# Patient Record
Sex: Female | Born: 1975 | Race: White | Hispanic: No | Marital: Married | Smoking: Never smoker
Health system: Southern US, Community
[De-identification: ages and names within clinical notes are randomized; demographics above are authoritative.]

## PROBLEM LIST (undated history)

## (undated) DIAGNOSIS — K589 Irritable bowel syndrome without diarrhea: Secondary | ICD-10-CM

## (undated) DIAGNOSIS — F419 Anxiety disorder, unspecified: Secondary | ICD-10-CM

## (undated) DIAGNOSIS — Z86718 Personal history of other venous thrombosis and embolism: Secondary | ICD-10-CM

## (undated) DIAGNOSIS — Z8759 Personal history of other complications of pregnancy, childbirth and the puerperium: Secondary | ICD-10-CM

## (undated) HISTORY — DX: Anxiety disorder, unspecified: F41.9

## (undated) HISTORY — DX: Irritable bowel syndrome, unspecified: K58.9

---

## 2011-05-09 ENCOUNTER — Ambulatory Visit: Payer: Self-pay | Admitting: Family Medicine

## 2011-06-16 ENCOUNTER — Ambulatory Visit: Payer: Self-pay | Admitting: Surgery

## 2011-06-21 ENCOUNTER — Ambulatory Visit: Payer: Self-pay | Admitting: Surgery

## 2011-06-22 LAB — PATHOLOGY REPORT

## 2011-08-16 HISTORY — PX: CHOLECYSTECTOMY: SHX55

## 2013-08-22 IMAGING — US US ABDOMEN LIMITED SLG ORGAN/ASCITES
1 series · 17 of 25 positions shown · non-contrast
Comparison: none

REASON FOR EXAM: Epigastric Pain Nausea Eval Gallbladder
COMMENTS:

[Series 1: us abdomen limited slg organ/ascites · 17 of 32 slices shown]
[im 1/32]
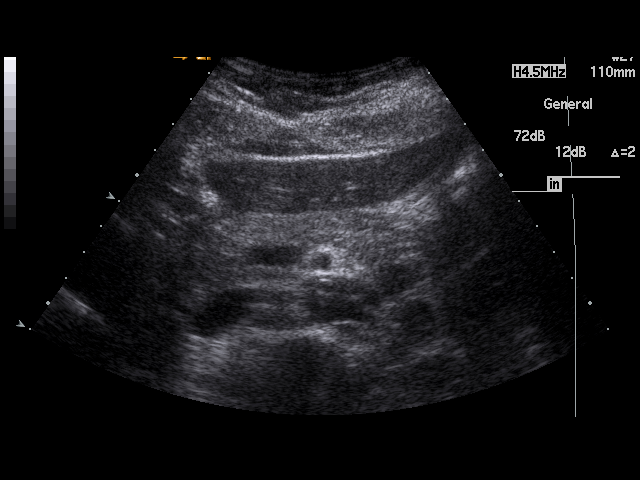
[im 3/32]
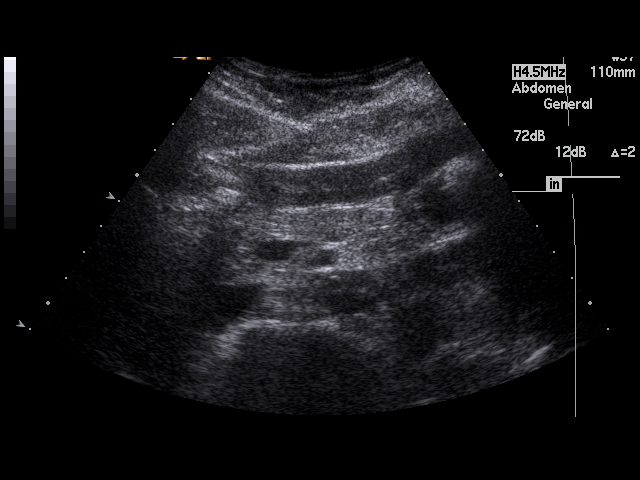
[im 4/32]
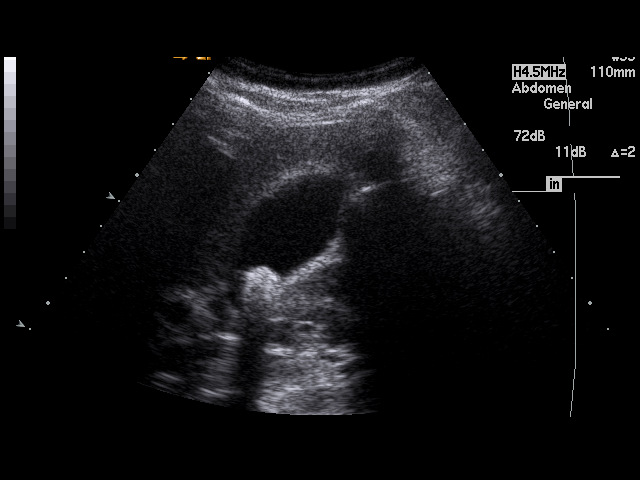
[im 7/32]
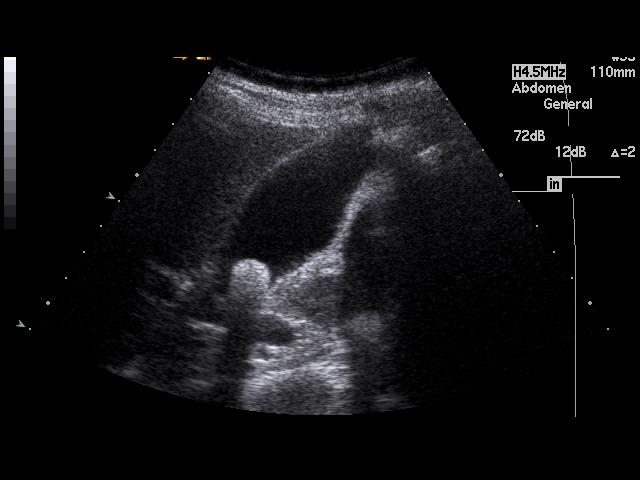
[im 8/32]
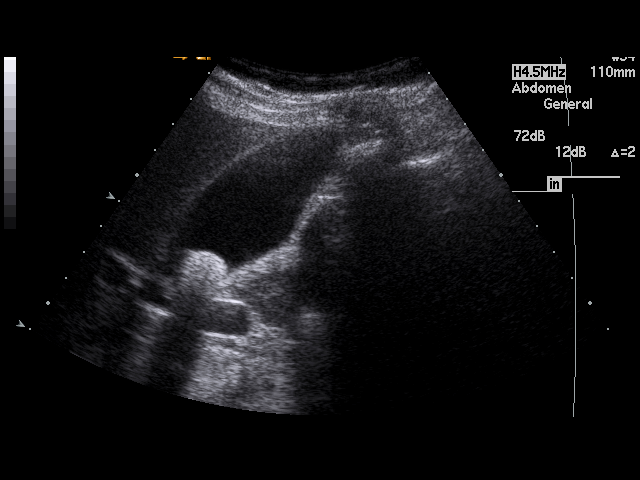
[im 11/32]
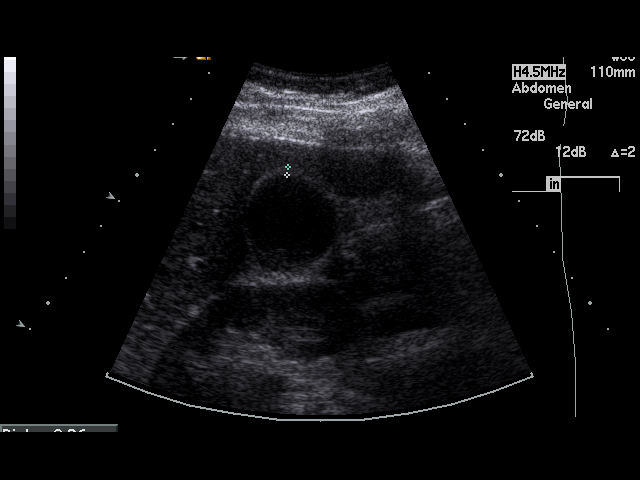
[im 12/32]
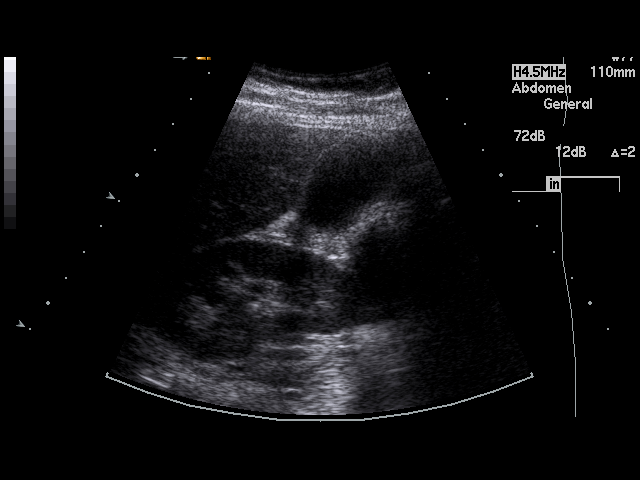
[im 15/32]
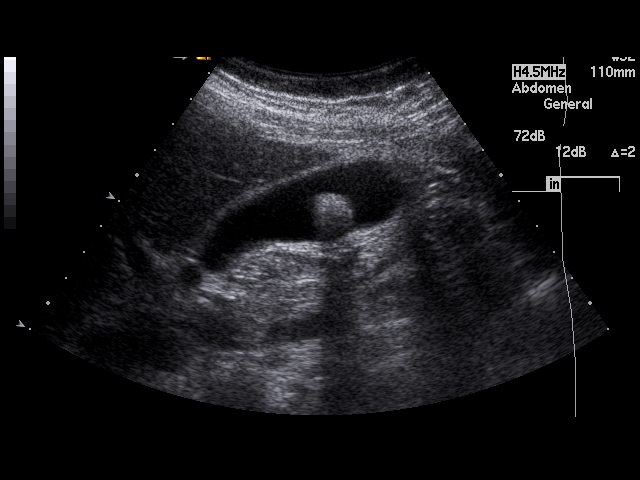
[im 16/32]
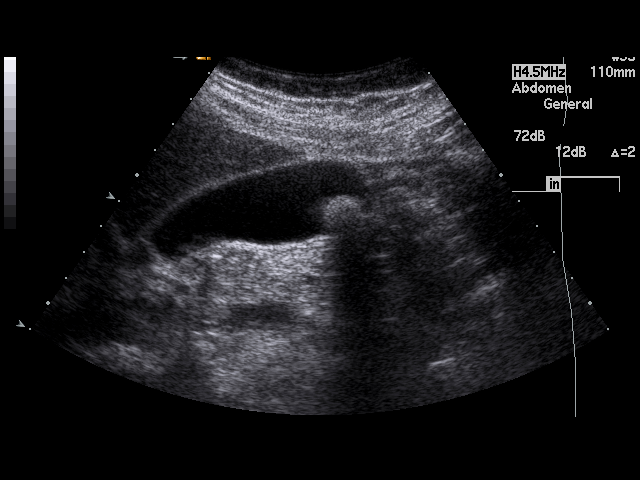
[im 17/32]
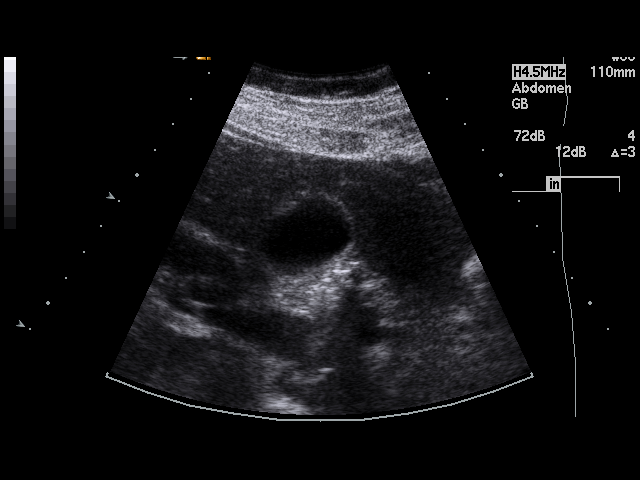
[im 20/32]
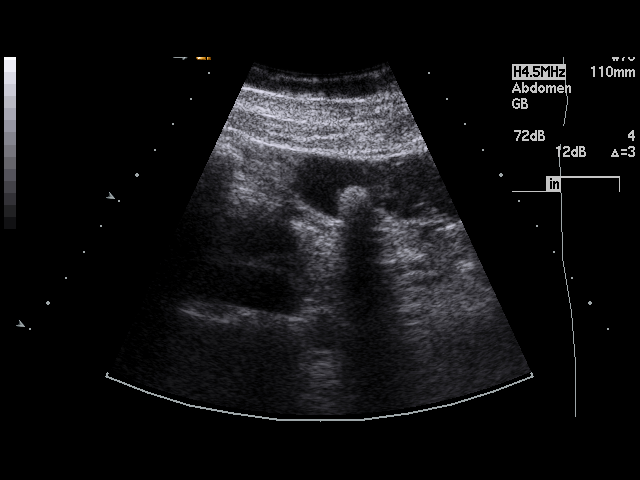
[im 21/32]
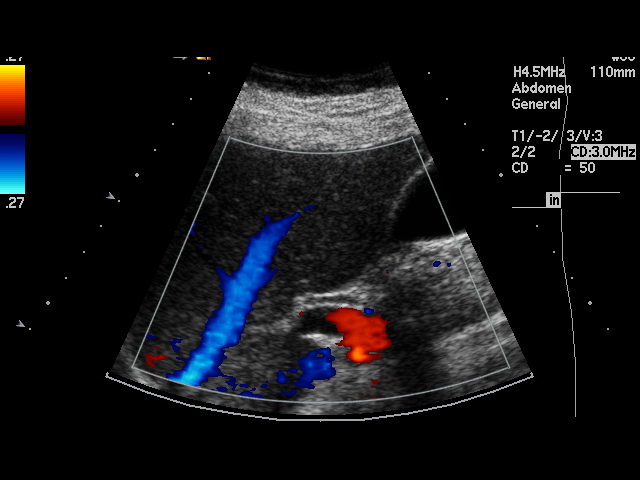
[im 24/32]
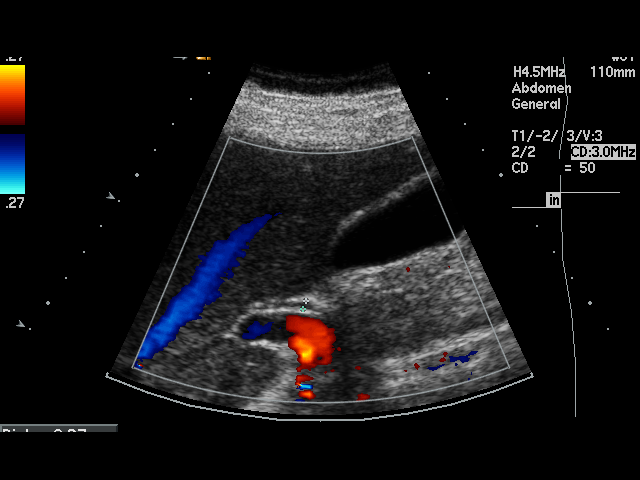
[im 25/32]
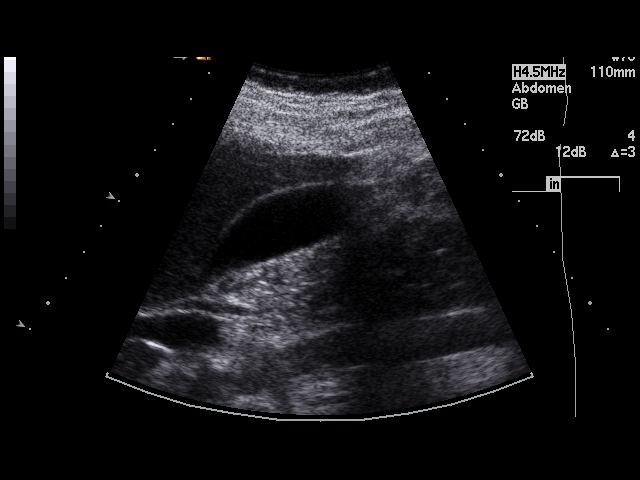
[im 28/32]
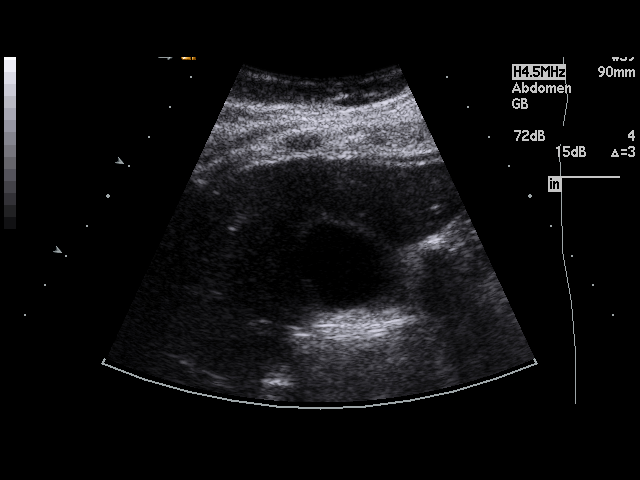
[im 29/32]
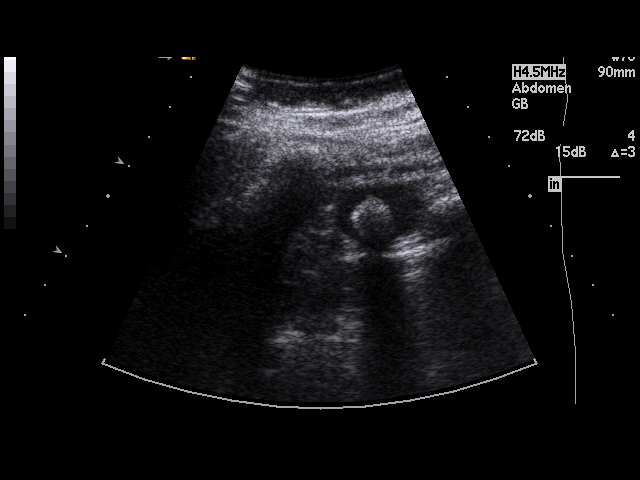
[im 32/32]
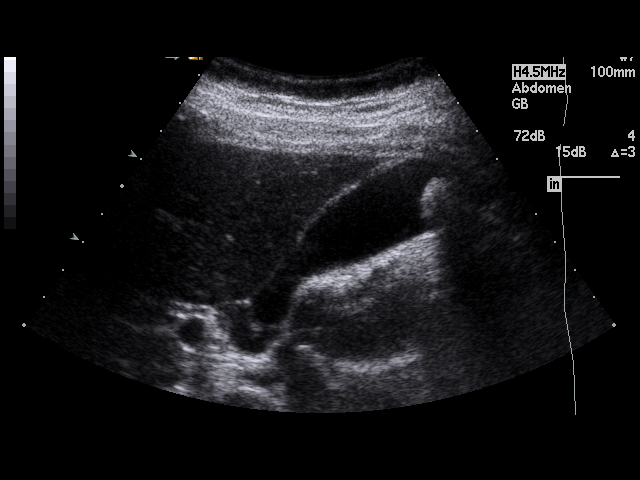

[17 of 25 positions shown; findings below may reference images not displayed]

PROCEDURE:     ADDY - ADDY ABDOMEN LTD 1 ORGAN OR QUAD  - May 09, 2011  [DATE]

RESULT:     Limited examination targeted to the gallbladder was performed.
There are mobile echo densities in the gallbladder compatible with
gallstones. There is no thickening of the gallbladder wall which measures
2.6 mm in thickness. Common bile duct measures 3.3 mm in diameter which is
within normal limits.

Note is made that the pancreas is visualized and is normal in appearance.
IMPRESSION: Cholelithiasis.

## 2015-03-19 ENCOUNTER — Ambulatory Visit (HOSPITAL_BASED_OUTPATIENT_CLINIC_OR_DEPARTMENT_OTHER)
Admission: RE | Admit: 2015-03-19 | Discharge: 2015-03-19 | Disposition: A | Discharge status: Home / Self Care | Payer: BLUE CROSS/BLUE SHIELD | Source: Ambulatory Visit | Attending: Obstetrics and Gynecology | Admitting: Obstetrics and Gynecology

## 2015-03-19 ENCOUNTER — Ambulatory Visit
Admission: RE | Admit: 2015-03-19 | Discharge: 2015-03-19 | Disposition: A | Discharge status: Home / Self Care | Payer: BLUE CROSS/BLUE SHIELD | Source: Ambulatory Visit | Attending: Obstetrics and Gynecology | Admitting: Obstetrics and Gynecology

## 2015-03-19 VITALS — BP 123/83 | Temp 98.2°F | Resp 18 | Ht 62.4 in | Wt 152.6 lb

## 2015-03-19 DIAGNOSIS — Z3A01 Less than 8 weeks gestation of pregnancy: Secondary | ICD-10-CM | POA: Diagnosis not present

## 2015-03-19 DIAGNOSIS — O99111 Other diseases of the blood and blood-forming organs and certain disorders involving the immune mechanism complicating pregnancy, first trimester: Secondary | ICD-10-CM

## 2015-03-19 DIAGNOSIS — O09899 Supervision of other high risk pregnancies, unspecified trimester: Secondary | ICD-10-CM | POA: Diagnosis present

## 2015-03-19 DIAGNOSIS — O021 Missed abortion: Secondary | ICD-10-CM | POA: Diagnosis not present

## 2015-03-19 DIAGNOSIS — Z86718 Personal history of other venous thrombosis and embolism: Secondary | ICD-10-CM

## 2015-03-19 DIAGNOSIS — D6859 Other primary thrombophilia: Secondary | ICD-10-CM

## 2015-03-19 HISTORY — DX: Personal history of other venous thrombosis and embolism: Z87.59

## 2015-03-19 HISTORY — DX: Personal history of other venous thrombosis and embolism: Z86.718

## 2015-03-19 LAB — CBC
HCT: 36.3 % (ref 35.0–47.0)
Hemoglobin: 12.5 g/dL (ref 12.0–16.0)
MCH: 30.4 pg (ref 26.0–34.0)
MCHC: 34.5 g/dL (ref 32.0–36.0)
MCV: 88 fL (ref 80.0–100.0)
Platelets: 274 10*3/uL (ref 150–440)
RBC: 4.12 MIL/uL (ref 3.80–5.20)
RDW: 13.2 % (ref 11.5–14.5)
WBC: 10.3 10*3/uL (ref 3.6–11.0)

## 2015-03-19 LAB — US MFM OB TRANSVAGINAL

## 2015-03-19 LAB — ANTIBODY SCREEN: Antibody Screen: NEGATIVE

## 2015-03-19 MED ORDER — ENOXAPARIN SODIUM 40 MG/0.4ML ~~LOC~~ SOLN
40.0000 mg | Freq: Once | SUBCUTANEOUS | Status: AC
  Administered 2015-03-19: 40 mg via SUBCUTANEOUS
  Filled 2015-03-19: qty 0.4

## 2015-03-19 MED ORDER — ENOXAPARIN SODIUM 40 MG/0.4ML ~~LOC~~ SOLN: 40.0000 mg | SUBCUTANEOUS | Status: DC

## 2015-03-19 NOTE — Progress Notes (Signed)
39 yo G2 p1001 WF form [place] referred from [facility] due to h/o DVT and positive pregnancy test  DVT hx-At gymnastic camp at age 86 associated with injury, Also notes her aunt died of VTE during a sab  Pt received no thrombo-prophylaxis during her pregnancy with her 76 yo daughter   U/s No FHR noted - 9mm embryo -   A&P Likely sab desired pregnancy  will f/u  8/11 for repeat scan  I contacted Nils Flack at [facility] [phone] for [facility]  Pt will call to establish care there and with Dr Dalbert Garnet at [facility]   For h/o DVT  Lovenox Rxd 40 qd hold for bleeding and at least 24 hours before a procedure- if D&C is required  Dr Fayrene Fearing recommends 2 weeks of postop lovenox Pt has difficulty with needles will bring her daughter to clinic for teaching of administration    Jimmey Ralph

## 2015-03-20 LAB — ABO/RH: ABO/RH(D): A POS

## 2015-03-23 ENCOUNTER — Ambulatory Visit
Admission: RE | Admit: 2015-03-23 | Discharge: 2015-03-23 | Disposition: A | Discharge status: Home / Self Care | Payer: BLUE CROSS/BLUE SHIELD | Source: Ambulatory Visit | Attending: Obstetrics and Gynecology | Admitting: Obstetrics and Gynecology

## 2015-03-23 NOTE — ED Notes (Signed)
Patient and patient's daughter educated on the administration of lovenox injection.  Patient's daughter verbalized understanding, demonstrated technique via teach back.  Patient instructed to start lovenox injections 24 hour post procedure, which will be performed tomorrow at [facility].  Patient instructed to start injection the Wednesday and also inform provider of medication prescribed.  Patient and patient's daughter verbalized understanding, questions answered.

## 2015-03-24 DIAGNOSIS — O039 Complete or unspecified spontaneous abortion without complication: Secondary | ICD-10-CM | POA: Insufficient documentation

## 2015-03-24 DIAGNOSIS — I82409 Acute embolism and thrombosis of unspecified deep veins of unspecified lower extremity: Secondary | ICD-10-CM | POA: Insufficient documentation

## 2015-03-26 ENCOUNTER — Inpatient Hospital Stay: Admission: RE | Admit: 2015-03-26 | Payer: BLUE CROSS/BLUE SHIELD | Source: Ambulatory Visit

## 2016-01-22 ENCOUNTER — Encounter (HOSPITAL_COMMUNITY): Payer: Self-pay | Admitting: *Deleted

## 2019-08-13 ENCOUNTER — Ambulatory Visit (INDEPENDENT_AMBULATORY_CARE_PROVIDER_SITE_OTHER): Payer: BC Managed Care – PPO | Admitting: Family Medicine

## 2019-08-13 ENCOUNTER — Other Ambulatory Visit: Payer: Self-pay

## 2019-08-13 ENCOUNTER — Encounter: Payer: Self-pay | Admitting: Family Medicine

## 2019-08-13 VITALS — BP 130/100 | HR 84 | Ht 59.0 in | Wt 160.0 lb

## 2019-08-13 DIAGNOSIS — F419 Anxiety disorder, unspecified: Secondary | ICD-10-CM | POA: Diagnosis not present

## 2019-08-13 DIAGNOSIS — G43909 Migraine, unspecified, not intractable, without status migrainosus: Secondary | ICD-10-CM

## 2019-08-13 DIAGNOSIS — Z7689 Persons encountering health services in other specified circumstances: Secondary | ICD-10-CM

## 2019-08-13 DIAGNOSIS — I10 Essential (primary) hypertension: Secondary | ICD-10-CM

## 2019-08-13 DIAGNOSIS — F329 Major depressive disorder, single episode, unspecified: Secondary | ICD-10-CM

## 2019-08-13 DIAGNOSIS — L309 Dermatitis, unspecified: Secondary | ICD-10-CM | POA: Diagnosis not present

## 2019-08-13 MED ORDER — HYDROCHLOROTHIAZIDE 12.5 MG PO CAPS: 12.5000 mg | ORAL_CAPSULE | Freq: Every day | ORAL | 1 refills | Status: DC

## 2019-08-13 NOTE — Progress Notes (Signed)
Date:  08/13/2019   Name:  Laura Galloway   DOB:  06-21-1976   MRN:  [id number]   Chief Complaint: Establish Care (PHQ9=10 and GAD7=14), Rash (hands have an irritation- been told she has contact dermatitis- creams not helping. Nails have ridges in them), and Headache (referral to neurology)  Patient is a 43 year old female who presents for a establishment of care exam. The patient reports the following problems: rrash/hands. Health maintenance has been reviewed and we will establish for Pap smear and physical at a later date when we recheck her blood pressure.  Rash This is a chronic problem. The current episode started more than 1 year ago. The problem has been waxing and waning since onset. The affected locations include the right hand and left hand. The rash is characterized by itchiness and redness. Associated with: chemicals. Associated symptoms include joint pain. Pertinent negatives include no anorexia, congestion, cough, diarrhea, eye pain, facial edema, fatigue, fever, nail changes, rhinorrhea, shortness of breath, sore throat or vomiting. Past treatments include topical steroids (mometasone). The treatment provided moderate relief. Her past medical history is significant for eczema. (Sister ? deformity)  Headache  This is a chronic problem. The current episode started more than 1 year ago. The problem occurs intermittently. The problem has been waxing and waning. The pain is located in the bilateral and retro-orbital region. The pain does not radiate. The quality of the pain is described as throbbing. The pain is at a severity of 8/10. Associated symptoms include blurred vision, insomnia, nausea, phonophobia and photophobia. Pertinent negatives include no abdominal pain, anorexia, back pain, coughing, dizziness, drainage, ear pain, eye pain, eye watering, fever, neck pain, numbness, rhinorrhea, sore throat or vomiting. Associated symptoms comments: ? Aura/nausea. The symptoms are  aggravated by bright light, noise and menstrual cycle. She has tried NSAIDs and acetaminophen (ibuprfen/tylenol) for the symptoms. The treatment provided mild relief. Her past medical history is significant for migraine headaches. (Sister ? deformity)  Depression        This is a chronic problem.  The current episode started more than 1 month ago.   The problem has been gradually improving since onset.  Associated symptoms include helplessness, hopelessness, insomnia, restlessness, decreased interest, headaches and sad.  Associated symptoms include no decreased concentration, no fatigue, not irritable, no appetite change, no body aches, no myalgias, no indigestion and no suicidal ideas.  Past treatments include SSRIs - Selective serotonin reuptake inhibitors.  Compliance with treatment is good.  Previous treatment provided moderate relief.  (sister ? deformity)   No results found for: CREATININE, BUN, NA, K, CL, CO2 No results found for: CHOL, HDL, LDLCALC, LDLDIRECT, TRIG, CHOLHDL No results found for: TSH No results found for: HGBA1C   Review of Systems  Constitutional: Negative for appetite change, chills, fatigue and fever.  HENT: Negative for congestion, drooling, ear discharge, ear pain, rhinorrhea and sore throat.   Eyes: Positive for blurred vision and photophobia. Negative for pain.  Respiratory: Negative for cough, shortness of breath and wheezing.   Cardiovascular: Negative for chest pain, palpitations and leg swelling.  Gastrointestinal: Positive for nausea. Negative for abdominal pain, anorexia, blood in stool, constipation, diarrhea and vomiting.  Endocrine: Negative for polydipsia.  Genitourinary: Negative for dysuria, frequency, hematuria and urgency.  Musculoskeletal: Positive for joint pain. Negative for back pain, myalgias and neck pain.  Skin: Positive for rash. Negative for nail changes.  Allergic/Immunologic: Negative for environmental allergies.  Neurological: Positive  for headaches. Negative for dizziness  and numbness.  Hematological: Negative for adenopathy. Does not bruise/bleed easily.  Psychiatric/Behavioral: Positive for depression. Negative for decreased concentration and suicidal ideas. The patient has insomnia. The patient is not nervous/anxious.     Patient Active Problem List   Diagnosis Date Noted  . DVT (deep venous thrombosis) (HCC) 03/24/2015  . Miscarriage 03/24/2015  . History of venous thromboembolism 03/19/2015  . [redacted] weeks gestation of pregnancy 03/19/2015    Allergies  Allergen Reactions  . Oxycodone Itching    Past Surgical History:  Procedure Laterality Date  . CHOLECYSTECTOMY  2013    Social History   Tobacco Use  . Smoking status: Never Smoker  . Smokeless tobacco: Never Used  Substance Use Topics  . Alcohol use: Yes    Comment: Last use 3 weeks ago  . Drug use: No     Medication list has been reviewed and updated.  Current Meds  Medication Sig  . dicyclomine (BENTYL) 20 MG tablet Take by mouth. GI  . mometasone (ELOCON) 0.1 % cream Apply topically.    PHQ 2/9 Scores 08/13/2019  PHQ - 2 Score 1  PHQ- 9 Score 10    BP Readings from Last 3 Encounters:  08/13/19 (!) 130/100  03/19/15 123/83    Physical Exam Vitals and nursing note reviewed.  Constitutional:      General: She is not irritable.    Appearance: She is well-developed.  HENT:     Head: Normocephalic.     Right Ear: External ear normal.     Left Ear: External ear normal.  Eyes:     General: Lids are everted, no foreign bodies appreciated. No scleral icterus.       Left eye: No foreign body or hordeolum.     Extraocular Movements: Extraocular movements intact.     Conjunctiva/sclera: Conjunctivae normal.     Right eye: Right conjunctiva is not injected.     Left eye: Left conjunctiva is not injected.     Pupils: Pupils are equal, round, and reactive to light.  Neck:     Thyroid: No thyromegaly.     Vascular: No JVD.     Trachea:  No tracheal deviation.  Cardiovascular:     Rate and Rhythm: Normal rate and regular rhythm.     Heart sounds: Normal heart sounds. No murmur. No friction rub. No gallop.   Pulmonary:     Effort: Pulmonary effort is normal. No respiratory distress.     Breath sounds: Normal breath sounds. No wheezing or rales.  Abdominal:     General: Bowel sounds are normal.     Palpations: Abdomen is soft. There is no mass.     Tenderness: There is no abdominal tenderness. There is no guarding or rebound.  Musculoskeletal:        General: No tenderness. Normal range of motion.     Cervical back: Normal range of motion and neck supple.  Lymphadenopathy:     Cervical: No cervical adenopathy.  Skin:    General: Skin is warm.     Findings: Rash present. Rash is scaling.  Neurological:     Mental Status: She is alert and oriented to person, place, and time.     Cranial Nerves: No cranial nerve deficit.     Deep Tendon Reflexes: Reflexes normal.  Psychiatric:        Mood and Affect: Mood is not anxious or depressed.     Wt Readings from Last 3 Encounters:  08/13/19 160 lb (72.6 kg)  03/19/15 152 lb 9.6 oz (69.2 kg)    BP (!) 130/100   Pulse 84   Ht 4\' 11"  (1.499 m)   Wt 160 lb (72.6 kg)   BMI 32.32 kg/m   Assessment and Plan:  1. Establishing care with new doctor, encounter for Patient establishing care with new physician.  Patient's previous encounters in the current system as well as care elsewhere at [facility] have been reviewed.  There is no recent imaging to discuss.  Lab work was also reviewed.  2. Migraine without status migrainosus, not intractable, unspecified migraine type Patient's had a long history of migraines for bilateral retro-orbital.  Patient is apparently not been on any Imitrex and is uncertain but possibly there has been some preventative possibly with Topamax.  We will refer to neurology to evaluate and better categorize the headaches and possibly get her on one of the  newer medications for migraine control since she has up to 2 a week. - Ambulatory referral to Neurology  3. Anxiety and depression Chronic.  Uncontrolled.  Stable.  Patient has previously been on Paxil and has not currently taking.  We have discussed the patient's concerns and there has been recent PTS concerns given the recent death of her parents that were close to her and within a few months of each other.  Patient will be referred to [facility] for evaluation and treatment. - Ambulatory referral to Psychiatry  4. Eczema, unspecified type Chronic.  Controlled.  Has been using a topical steroid.  However and then refilling this yet I would like to have it evaluated in his current state by dermatology and given the defining nature of this in her life may be consider something like Dupixent. - Ambulatory referral to Dermatology  5. Essential hypertension Patient has elevated blood pressure readings and this is been noted on previous encounters as well.  Given that she still is in childbearing age we will initiate first with hydrochlorothiazide and recheck in 6 weeks to see if we have results.

## 2019-09-03 DIAGNOSIS — G43109 Migraine with aura, not intractable, without status migrainosus: Secondary | ICD-10-CM | POA: Insufficient documentation

## 2019-09-04 ENCOUNTER — Other Ambulatory Visit: Payer: Self-pay

## 2019-09-04 ENCOUNTER — Telehealth: Payer: Self-pay

## 2019-09-04 DIAGNOSIS — L309 Dermatitis, unspecified: Secondary | ICD-10-CM

## 2019-09-04 MED ORDER — TRIAMCINOLONE ACETONIDE 0.1 % EX CREA: 1.0000 "application " | TOPICAL_CREAM | Freq: Two times a day (BID) | CUTANEOUS | 0 refills | Status: DC

## 2019-09-04 NOTE — Progress Notes (Signed)
Triamcinolone cream sent into [facility]

## 2019-09-04 NOTE — Telephone Encounter (Signed)
Pt called requesting a "steroid cream" for her hands until she can get in with derm. On Feb 19th- sent in triamcinolone cream to [facility]

## 2019-09-05 ENCOUNTER — Other Ambulatory Visit: Payer: Self-pay | Admitting: Family Medicine

## 2019-09-24 ENCOUNTER — Ambulatory Visit: Payer: BLUE CROSS/BLUE SHIELD | Admitting: Family Medicine

## 2019-09-30 ENCOUNTER — Other Ambulatory Visit: Payer: Self-pay | Admitting: Family Medicine

## 2019-10-02 ENCOUNTER — Other Ambulatory Visit: Payer: Self-pay

## 2019-10-02 ENCOUNTER — Ambulatory Visit (INDEPENDENT_AMBULATORY_CARE_PROVIDER_SITE_OTHER): Payer: BC Managed Care – PPO | Admitting: Family Medicine

## 2019-10-02 ENCOUNTER — Encounter: Payer: Self-pay | Admitting: Family Medicine

## 2019-10-02 VITALS — BP 138/80 | HR 80 | Ht 59.0 in | Wt 161.0 lb

## 2019-10-02 DIAGNOSIS — Z01419 Encounter for gynecological examination (general) (routine) without abnormal findings: Secondary | ICD-10-CM

## 2019-10-02 DIAGNOSIS — E663 Overweight: Secondary | ICD-10-CM

## 2019-10-02 DIAGNOSIS — R5383 Other fatigue: Secondary | ICD-10-CM | POA: Diagnosis not present

## 2019-10-02 DIAGNOSIS — I10 Essential (primary) hypertension: Secondary | ICD-10-CM | POA: Diagnosis not present

## 2019-10-02 MED ORDER — HYDROCHLOROTHIAZIDE 12.5 MG PO CAPS: ORAL_CAPSULE | ORAL | 1 refills | Status: DC

## 2019-10-02 NOTE — Progress Notes (Signed)
Date:  10/02/2019   Name:  Laura Galloway   DOB:  Jun 16, 1976   MRN:  [id number]   Chief Complaint: Hypertension (recheck b/p) and referral GYN (for pap and further eval/ Physical)  Hypertension This is a chronic problem. The current episode started more than 1 year ago. The problem has been gradually improving since onset. The problem is controlled. Pertinent negatives include no anxiety, blurred vision, chest pain, headaches, malaise/fatigue, neck pain, orthopnea, palpitations, peripheral edema, PND, shortness of breath or sweats. There are no associated agents to hypertension. There are no known risk factors for coronary artery disease. Past treatments include diuretics. The current treatment provides moderate improvement. There are no compliance problems.  There is no history of angina, kidney disease, CAD/MI, CVA, heart failure, left ventricular hypertrophy, PVD or retinopathy. Identifiable causes of hypertension include a thyroid problem. There is no history of chronic renal disease, a hypertension causing med or renovascular disease.  Thyroid Problem Presents for initial visit. Symptoms include anxiety, cold intolerance, depressed mood, dry skin, hair loss and weight gain. Patient reports no constipation, diaphoresis, diarrhea, fatigue, heat intolerance, hoarse voice, leg swelling, menstrual problem, nail problem, palpitations, tremors, visual change or weight loss. There is no history of heart failure.    No results found for: CREATININE, BUN, NA, K, CL, CO2 No results found for: CHOL, HDL, LDLCALC, LDLDIRECT, TRIG, CHOLHDL No results found for: TSH No results found for: HGBA1C   Review of Systems  Constitutional: Positive for weight gain. Negative for chills, diaphoresis, fatigue, fever, malaise/fatigue, unexpected weight change and weight loss.  HENT: Negative for congestion, ear discharge, ear pain, hoarse voice, rhinorrhea, sinus pressure, sneezing and sore throat.   Eyes:  Negative for blurred vision, photophobia, pain, discharge, redness and itching.  Respiratory: Negative for cough, shortness of breath, wheezing and stridor.   Cardiovascular: Negative for chest pain, palpitations, orthopnea and PND.  Gastrointestinal: Negative for abdominal pain, blood in stool, constipation, diarrhea, nausea and vomiting.  Endocrine: Positive for cold intolerance. Negative for heat intolerance, polydipsia, polyphagia and polyuria.  Genitourinary: Negative for dysuria, flank pain, frequency, hematuria, menstrual problem, pelvic pain, urgency, vaginal bleeding and vaginal discharge.  Musculoskeletal: Negative for arthralgias, back pain, myalgias and neck pain.  Skin: Negative for rash.  Allergic/Immunologic: Negative for environmental allergies and food allergies.  Neurological: Negative for dizziness, tremors, weakness, light-headedness, numbness and headaches.  Hematological: Negative for adenopathy. Does not bruise/bleed easily.  Psychiatric/Behavioral: Negative for dysphoric mood. The patient is nervous/anxious.     Patient Active Problem List   Diagnosis Date Noted  . DVT (deep venous thrombosis) (HCC) 03/24/2015  . Miscarriage 03/24/2015  . History of venous thromboembolism 03/19/2015  . [redacted] weeks gestation of pregnancy 03/19/2015    Allergies  Allergen Reactions  . Oxycodone Itching    Past Surgical History:  Procedure Laterality Date  . CHOLECYSTECTOMY  2013    Social History   Tobacco Use  . Smoking status: Never Smoker  . Smokeless tobacco: Never Used  Substance Use Topics  . Alcohol use: Yes    Comment: Last use 3 weeks ago  . Drug use: No     Medication list has been reviewed and updated.  Current Meds  Medication Sig  . dicyclomine (BENTYL) 20 MG tablet Take by mouth. GI  . EUCRISA 2 % OINT Apply topically 2 (two) times daily. derm  . hydrochlorothiazide (MICROZIDE) 12.5 MG capsule TAKE 1 CAPSULE BY MOUTH EVERY DAY  . LORazepam (ATIVAN)  0.5 MG tablet  Take by mouth.  . mometasone (ELOCON) 0.1 % cream Apply topically.  . nortriptyline (PAMELOR) 10 MG capsule Take 2 capsules by mouth at bedtime. neurology  . NURTEC 75 MG TBDP neurology  . triamcinolone cream (KENALOG) 0.1 % Apply 1 application topically 2 (two) times daily.    PHQ 2/9 Scores 10/02/2019 08/13/2019  PHQ - 2 Score 0 1  PHQ- 9 Score 0 10    BP Readings from Last 3 Encounters:  10/02/19 138/80  08/13/19 (!) 130/100  03/19/15 123/83    Physical Exam Vitals and nursing note reviewed.  Constitutional:      General: She is not in acute distress.    Appearance: She is not diaphoretic.  HENT:     Head: Normocephalic and atraumatic.     Right Ear: Tympanic membrane, ear canal and external ear normal. There is no impacted cerumen.     Left Ear: Tympanic membrane, ear canal and external ear normal. There is no impacted cerumen.     Nose: Nose normal. No congestion or rhinorrhea.     Mouth/Throat:     Mouth: Mucous membranes are moist.  Eyes:     General:        Right eye: No discharge.        Left eye: No discharge.     Conjunctiva/sclera: Conjunctivae normal.     Pupils: Pupils are equal, round, and reactive to light.  Neck:     Thyroid: No thyromegaly.     Vascular: No JVD.  Cardiovascular:     Rate and Rhythm: Normal rate and regular rhythm.     Heart sounds: Normal heart sounds. No murmur. No friction rub. No gallop.   Pulmonary:     Effort: Pulmonary effort is normal. No respiratory distress.     Breath sounds: Normal breath sounds. No stridor. No wheezing, rhonchi or rales.  Abdominal:     General: Bowel sounds are normal.     Palpations: Abdomen is soft. There is no mass.     Tenderness: There is no abdominal tenderness. There is no guarding.  Musculoskeletal:        General: Normal range of motion.     Cervical back: Normal range of motion and neck supple.  Lymphadenopathy:     Cervical: No cervical adenopathy.  Skin:    General: Skin is  warm and dry.     Capillary Refill: Capillary refill takes less than 2 seconds.  Neurological:     Mental Status: She is alert.     Deep Tendon Reflexes: Reflexes are normal and symmetric.     Wt Readings from Last 3 Encounters:  10/02/19 161 lb (73 kg)  08/13/19 160 lb (72.6 kg)  03/19/15 152 lb 9.6 oz (69.2 kg)    BP 138/80   Pulse 80   Ht 4\' 11"  (1.499 m)   Wt 161 lb (73 kg)   LMP 09/04/2019 (Approximate)   BMI 32.52 kg/m   Assessment and Plan: 1. Essential hypertension Chronic.  Controlled.  Stable.  Continue hydrochlorothiazide 12.5 mg once a day.  Will check renal function panel. - Renal Function Panel - hydrochlorothiazide (MICROZIDE) 12.5 MG capsule; TAKE 1 CAPSULE BY MOUTH EVERY DAY  Dispense: 90 capsule; Refill: 1  2. Fatigue, unspecified type Chronic.  Controlled.  Stable.  Will initiate fatigue work-up with thyroid panel with TSH, and CBC for rule out anemia, and renal function panel to evaluate for diabetes and any electrolyte concerns. - Thyroid Panel With TSH - CBC  with Differential/Platelet - Renal Function Panel  3. Overweight (BMI 25.0-29.9) Patient's BMI puts her over 30 however she has on boots and clothing which make redness to pressure over the 30 mark.  At this point in time we will check a lipid panel to make sure that she does not have any familial hyperlipidemia and patient has been given Mediterranean diet to help with weight loss.Health risks of being over weight were discussed and patient was counseled on weight loss options and exercise. - Lipid Panel With LDL/HDL Ratio  4. Visit for gynecologic examination Patient has been referred to gynecology for routine Pap smears and patient has been been referred for routine GYN care. - Ambulatory referral to Obstetrics / Gynecology

## 2019-10-03 LAB — RENAL FUNCTION PANEL
Albumin: 4.6 g/dL (ref 3.8–4.8)
BUN/Creatinine Ratio: 11 (ref 9–23)
BUN: 7 mg/dL (ref 6–24)
CO2: 25 mmol/L (ref 20–29)
Calcium: 9.5 mg/dL (ref 8.7–10.2)
Chloride: 99 mmol/L (ref 96–106)
Creatinine, Ser: 0.62 mg/dL (ref 0.57–1.00)
GFR calc Af Amer: 126 mL/min/{1.73_m2} (ref >59)
GFR calc non Af Amer: 109 mL/min/{1.73_m2} (ref >59)
Glucose: 86 mg/dL (ref 65–99)
Phosphorus: 3 mg/dL (ref 3.0–4.3)
Potassium: 4.1 mmol/L (ref 3.5–5.2)
Sodium: 137 mmol/L (ref 134–144)

## 2019-10-03 LAB — THYROID PANEL WITH TSH
Free Thyroxine Index: 1.2 (ref 1.2–4.9)
T3 Uptake Ratio: 20 % — ABNORMAL LOW (ref 24–39)
T4, Total: 6.2 ug/dL (ref 4.5–12.0)
TSH: 2.06 u[IU]/mL (ref 0.450–4.500)

## 2019-10-03 LAB — CBC WITH DIFFERENTIAL/PLATELET
Basophils Absolute: 0 10*3/uL (ref 0.0–0.2)
Basos: 1 %
EOS (ABSOLUTE): 0.1 10*3/uL (ref 0.0–0.4)
Eos: 1 %
Hematocrit: 41.1 % (ref 34.0–46.6)
Hemoglobin: 13.6 g/dL (ref 11.1–15.9)
Immature Grans (Abs): 0 10*3/uL (ref 0.0–0.1)
Immature Granulocytes: 1 %
Lymphocytes Absolute: 2.4 10*3/uL (ref 0.7–3.1)
Lymphs: 30 %
MCH: 29.1 pg (ref 26.6–33.0)
MCHC: 33.1 g/dL (ref 31.5–35.7)
MCV: 88 fL (ref 79–97)
Monocytes Absolute: 0.6 10*3/uL (ref 0.1–0.9)
Monocytes: 7 %
Neutrophils Absolute: 4.9 10*3/uL (ref 1.4–7.0)
Neutrophils: 60 %
Platelets: 321 10*3/uL (ref 150–450)
RBC: 4.68 x10E6/uL (ref 3.77–5.28)
RDW: 12.1 % (ref 11.7–15.4)
WBC: 8 10*3/uL (ref 3.4–10.8)

## 2019-10-03 LAB — LIPID PANEL WITH LDL/HDL RATIO
Cholesterol, Total: 219 mg/dL — ABNORMAL HIGH (ref 100–199)
HDL: 49 mg/dL (ref >39)
LDL Chol Calc (NIH): 157 mg/dL — ABNORMAL HIGH (ref 0–99)
LDL/HDL Ratio: 3.2 ratio (ref 0.0–3.2)
Triglycerides: 76 mg/dL (ref 0–149)
VLDL Cholesterol Cal: 13 mg/dL (ref 5–40)

## 2019-10-04 ENCOUNTER — Telehealth: Payer: Self-pay | Admitting: Obstetrics & Gynecology

## 2019-10-04 NOTE — Telephone Encounter (Signed)
[facility] referring for Visit for gynecologic examination. Called and left voicemail for patient to call back to be schedule

## 2019-10-07 NOTE — Telephone Encounter (Signed)
Called and left voice mail for patient to call back to be schedule °

## 2019-10-31 ENCOUNTER — Ambulatory Visit: Payer: Self-pay | Admitting: Advanced Practice Midwife

## 2019-11-12 ENCOUNTER — Telehealth: Payer: Self-pay

## 2019-11-12 NOTE — Telephone Encounter (Signed)
I called pt and left a message after receiving a notification from Tresa Moore with Dr Tiburcio Pea' office, stating they have been unable to reach pt to schedule an appt. Also, left their telephone number on vm. [phone]

## 2019-11-18 ENCOUNTER — Other Ambulatory Visit: Payer: Self-pay | Admitting: Acute Care

## 2019-11-18 DIAGNOSIS — G4459 Other complicated headache syndrome: Secondary | ICD-10-CM

## 2019-11-23 ENCOUNTER — Ambulatory Visit: Payer: BC Managed Care – PPO

## 2019-12-04 ENCOUNTER — Other Ambulatory Visit: Payer: Self-pay

## 2019-12-04 ENCOUNTER — Ambulatory Visit
Admission: RE | Admit: 2019-12-04 | Discharge: 2019-12-04 | Disposition: A | Discharge status: Home / Self Care | Payer: BC Managed Care – PPO | Source: Ambulatory Visit | Attending: Acute Care | Admitting: Acute Care

## 2019-12-04 DIAGNOSIS — G4459 Other complicated headache syndrome: Secondary | ICD-10-CM | POA: Insufficient documentation

## 2019-12-10 ENCOUNTER — Ambulatory Visit: Payer: BC Managed Care – PPO | Admitting: Family Medicine

## 2020-03-18 ENCOUNTER — Ambulatory Visit: Payer: BC Managed Care – PPO | Admitting: Family Medicine

## 2020-04-15 ENCOUNTER — Ambulatory Visit: Payer: BC Managed Care – PPO | Admitting: Family Medicine

## 2020-04-16 ENCOUNTER — Other Ambulatory Visit: Payer: Self-pay | Admitting: Family Medicine

## 2020-04-16 DIAGNOSIS — I10 Essential (primary) hypertension: Secondary | ICD-10-CM

## 2020-04-30 ENCOUNTER — Other Ambulatory Visit: Payer: Self-pay | Admitting: Family Medicine

## 2020-04-30 DIAGNOSIS — I10 Essential (primary) hypertension: Secondary | ICD-10-CM

## 2020-06-24 DIAGNOSIS — M25859 Other specified joint disorders, unspecified hip: Secondary | ICD-10-CM | POA: Insufficient documentation

## 2021-04-29 ENCOUNTER — Emergency Department
Admission: EM | Admit: 2021-04-29 | Discharge: 2021-04-29 | Disposition: A | Discharge status: Home / Self Care | Payer: BC Managed Care – PPO | Attending: Emergency Medicine | Admitting: Emergency Medicine

## 2021-04-29 ENCOUNTER — Emergency Department: Payer: BC Managed Care – PPO

## 2021-04-29 ENCOUNTER — Other Ambulatory Visit: Payer: Self-pay

## 2021-04-29 DIAGNOSIS — N12 Tubulo-interstitial nephritis, not specified as acute or chronic: Secondary | ICD-10-CM | POA: Diagnosis not present

## 2021-04-29 DIAGNOSIS — R109 Unspecified abdominal pain: Secondary | ICD-10-CM | POA: Diagnosis present

## 2021-04-29 LAB — URINALYSIS, COMPLETE (UACMP) WITH MICROSCOPIC
Bacteria, UA: NONE SEEN
Bilirubin Urine: NEGATIVE
Glucose, UA: NEGATIVE mg/dL
Ketones, ur: 20 mg/dL — AB
Nitrite: NEGATIVE
Protein, ur: NEGATIVE mg/dL
Specific Gravity, Urine: 1.02 (ref 1.005–1.030)
pH: 5 (ref 5.0–8.0)

## 2021-04-29 LAB — CBC
HCT: 38.6 % (ref 36.0–46.0)
Hemoglobin: 13.2 g/dL (ref 12.0–15.0)
MCH: 29.9 pg (ref 26.0–34.0)
MCHC: 34.2 g/dL (ref 30.0–36.0)
MCV: 87.3 fL (ref 80.0–100.0)
Platelets: 306 10*3/uL (ref 150–400)
RBC: 4.42 MIL/uL (ref 3.87–5.11)
RDW: 12.2 % (ref 11.5–15.5)
WBC: 15.8 10*3/uL — ABNORMAL HIGH (ref 4.0–10.5)
nRBC: 0 % (ref 0.0–0.2)

## 2021-04-29 LAB — COMPREHENSIVE METABOLIC PANEL
ALT: 36 U/L (ref 0–44)
AST: 24 U/L (ref 15–41)
Albumin: 4.3 g/dL (ref 3.5–5.0)
Alkaline Phosphatase: 71 U/L (ref 38–126)
Anion gap: 10 (ref 5–15)
BUN: 10 mg/dL (ref 6–20)
CO2: 26 mmol/L (ref 22–32)
Calcium: 9.3 mg/dL (ref 8.9–10.3)
Chloride: 98 mmol/L (ref 98–111)
Creatinine, Ser: 0.63 mg/dL (ref 0.44–1.00)
GFR, Estimated: >60 mL/min (ref >60)
Glucose, Bld: 97 mg/dL (ref 70–99)
Potassium: 3.9 mmol/L (ref 3.5–5.1)
Sodium: 134 mmol/L — ABNORMAL LOW (ref 135–145)
Total Bilirubin: 0.9 mg/dL (ref 0.3–1.2)
Total Protein: 7.4 g/dL (ref 6.5–8.1)

## 2021-04-29 LAB — POC URINE PREG, ED: Preg Test, Ur: NEGATIVE

## 2021-04-29 LAB — LACTIC ACID, PLASMA
Lactic Acid, Venous: 0.9 mmol/L (ref 0.5–1.9)
Lactic Acid, Venous: 1.2 mmol/L (ref 0.5–1.9)

## 2021-04-29 LAB — LIPASE, BLOOD: Lipase: 27 U/L (ref 11–51)

## 2021-04-29 MED ORDER — ONDANSETRON HCL 4 MG/2ML IJ SOLN
4.0000 mg | Freq: Once | INTRAMUSCULAR | Status: AC
  Administered 2021-04-29: 4 mg via INTRAVENOUS
  Filled 2021-04-29: qty 2

## 2021-04-29 MED ORDER — CEPHALEXIN 500 MG PO CAPS: 500.0000 mg | ORAL_CAPSULE | Freq: Four times a day (QID) | ORAL | 0 refills | Status: AC

## 2021-04-29 MED ORDER — MORPHINE SULFATE (PF) 4 MG/ML IV SOLN
4.0000 mg | Freq: Once | INTRAVENOUS | Status: AC
  Administered 2021-04-29: 4 mg via INTRAVENOUS
  Filled 2021-04-29: qty 1

## 2021-04-29 MED ORDER — SODIUM CHLORIDE 0.9 % IV SOLN
1.0000 g | Freq: Once | INTRAVENOUS | Status: AC
  Administered 2021-04-29: 1 g via INTRAVENOUS
  Filled 2021-04-29: qty 10

## 2021-04-29 MED ORDER — LACTATED RINGERS IV BOLUS
1000.0000 mL | Freq: Once | INTRAVENOUS | Status: AC
  Administered 2021-04-29: 1000 mL via INTRAVENOUS

## 2021-04-29 MED ORDER — KETOROLAC TROMETHAMINE 30 MG/ML IJ SOLN
15.0000 mg | Freq: Once | INTRAMUSCULAR | Status: AC
  Administered 2021-04-29: 15 mg via INTRAVENOUS
  Filled 2021-04-29: qty 1

## 2021-04-29 MED ORDER — ONDANSETRON 4 MG PO TBDP: 4.0000 mg | ORAL_TABLET | Freq: Three times a day (TID) | ORAL | 0 refills | Status: AC | PRN

## 2021-04-29 MED ORDER — IOHEXOL 350 MG/ML SOLN
100.0000 mL | Freq: Once | INTRAVENOUS | Status: AC | PRN
  Administered 2021-04-29: 100 mL via INTRAVENOUS
  Filled 2021-04-29: qty 100

## 2021-04-29 NOTE — ED Provider Notes (Signed)
[facility] [place] Emergency Department Provider Note ____________________________________________   Event Date/Time   First MD Initiated Contact with Patient 04/29/21 2013     (approximate)  I have reviewed the triage vital signs and the nursing notes.  HISTORY  Chief Complaint Abdominal Pain and Abnormal labs   HPI Laura Galloway is a 45 y.o. femalewho presents to the ED for evaluation of abdominal pain.  Chart review indicates saw her PCP earlier today. CBC w WBC of 15k.  Patient reports a history of IBS and dealing with frequent stools and abdominal discomfort for decades.  She reports about 3 days of significantly worsening lower abdominal pain and loose stools.  Reports 10/10 lower abdominal pain that is severe and quite different from her typical IBS discomfort.  Reports diarrhea and loose stools without hematochezia or melena.  Reports 1 or 2 episodes of emesis, nonbloody nonbilious, but minimizes this and indicates it is mostly lower.  Denies dysuria, vaginal discharge or bleeding.  Denies fevers, but does report subjective chills and sweats.  Upon my reassessments, patient does report having foul-smelling urine but no dysuria or frequency.  Past Medical History:  Diagnosis Date   Anxiety    IBS (irritable bowel syndrome)    Maternal DVT (deep vein thrombosis), history of 45 years old   Family h/o DVT    Patient Active Problem List   Diagnosis Date Noted   DVT (deep venous thrombosis) (HCC) 03/24/2015   Miscarriage 03/24/2015   History of venous thromboembolism 03/19/2015   [redacted] weeks gestation of pregnancy 03/19/2015    Past Surgical History:  Procedure Laterality Date   CHOLECYSTECTOMY  2013    Prior to Admission medications   Medication Sig Start Date End Date Taking? Authorizing Provider  cephALEXin (KEFLEX) 500 MG capsule Take 1 capsule (500 mg total) by mouth 4 (four) times daily for 10 days. 04/29/21 05/09/21 Yes Delton Prairie, MD   ondansetron (ZOFRAN ODT) 4 MG disintegrating tablet Take 1 tablet (4 mg total) by mouth every 8 (eight) hours as needed for nausea or vomiting. 04/29/21  Yes Delton Prairie, MD  dicyclomine (BENTYL) 20 MG tablet Take by mouth. GI    [provider]  EUCRISA 2 % OINT Apply topically 2 (two) times daily. derm 09/18/19   [provider]  hydrochlorothiazide (MICROZIDE) 12.5 MG capsule TAKE 1 CAPSULE BY MOUTH EVERY DAY 04/16/20   Duanne Limerick, MD  LORazepam (ATIVAN) 0.5 MG tablet Take by mouth.    [provider]  nortriptyline (PAMELOR) 10 MG capsule Take 2 capsules by mouth at bedtime. neurology 09/03/19   [provider]  NURTEC 75 MG TBDP neurology 09/19/19   [provider]  triamcinolone cream (KENALOG) 0.1 % Apply 1 application topically 2 (two) times daily. 09/04/19   Duanne Limerick, MD    Allergies Oxycodone  Family History  Problem Relation Age of Onset   Diabetes Father    Diabetes Paternal Grandmother    Diabetes Paternal Grandfather     Social History Social History   Tobacco Use   Smoking status: Never   Smokeless tobacco: Never  Substance Use Topics   Alcohol use: Yes    Comment: Last use 3 weeks ago   Drug use: No    Review of Systems  Constitutional: Positive for subjective chills and sweats Eyes: No visual changes. ENT: No sore throat. Cardiovascular: Denies chest pain. Respiratory: Denies shortness of breath. Gastrointestinal:  no vomiting.   No constipation. Positive for abdominal  pain, nausea and diarrhea Genitourinary: Negative for dysuria. Musculoskeletal: Negative for back pain. Skin: Negative for rash. Neurological: Negative for headaches, focal weakness or numbness.  ____________________________________________   PHYSICAL EXAM:  VITAL SIGNS: Vitals:   04/29/21 1803 04/29/21 2131  BP: (!) 160/113 (!) 153/103  Pulse: (!) 119 91  Resp: 17 17  Temp: 98.6 F (37 C)   SpO2: 100% 98%      Constitutional: Alert and oriented.  Appears uncomfortable, but in no acute distress. Eyes: Conjunctivae are normal. PERRL. EOMI. Head: Atraumatic. Nose: No congestion/rhinnorhea. Mouth/Throat: Mucous membranes are dry.  Oropharynx non-erythematous. Neck: No stridor. No cervical spine tenderness to palpation. Cardiovascular: Tachycardic rate, regular rhythm. Grossly normal heart sounds.  Good peripheral circulation. Respiratory: Normal respiratory effort.  No retractions. Lungs CTAB. Gastrointestinal: Soft , nondistended,. No CVA tenderness. Tender throughout the lower abdomen, but most tender to the LLQ.  Has some voluntary guarding to the LLQ.  Benign upper abdomen. Musculoskeletal: No lower extremity tenderness nor edema.  No joint effusions. No signs of acute trauma. Neurologic:  Normal speech and language. No gross focal neurologic deficits are appreciated. No gait instability noted. Skin:  Skin is warm, dry and intact. No rash noted. Psychiatric: Mood and affect are normal. Speech and behavior are normal.  ____________________________________________   LABS (all labs ordered are listed, but only abnormal results are displayed)  Labs Reviewed  COMPREHENSIVE METABOLIC PANEL - Abnormal; Notable for the following components:      Result Value   Sodium 134 (*)    All other components within normal limits  CBC - Abnormal; Notable for the following components:   WBC 15.8 (*)    All other components within normal limits  URINALYSIS, COMPLETE (UACMP) WITH MICROSCOPIC - Abnormal; Notable for the following components:   Color, Urine YELLOW (*)    APPearance HAZY (*)    Hgb urine dipstick SMALL (*)    Ketones, ur 20 (*)    Leukocytes,Ua TRACE (*)    All other components within normal limits  URINE CULTURE  LIPASE, BLOOD  LACTIC ACID, PLASMA  LACTIC ACID, PLASMA  POC URINE PREG, ED   ____________________________________________  12 Lead  EKG   ____________________________________________  RADIOLOGY  ED MD interpretation: CT reviewed by me with evidence of left pyonephritis  Official radiology report(s): CT ABDOMEN PELVIS W CONTRAST  Result Date: 04/29/2021 CLINICAL DATA:  Left lower quadrant pain, leukocytosis, diarrhea, evaluate for diverticulitis EXAM: CT ABDOMEN AND PELVIS WITH CONTRAST TECHNIQUE: Multidetector CT imaging of the abdomen and pelvis was performed using the standard protocol following bolus administration of intravenous contrast. CONTRAST:  OMNIPAQUE IOHEXOL 350 MG/ML SOLN COMPARISON:  None. FINDINGS: Lower chest: Minimal dependent atelectasis in the lung bases. Hepatobiliary: Liver is within normal limits. Status post cholecystectomy. No intrahepatic or extrahepatic ductal dilatation. Pancreas: Within normal limits. Spleen: Within normal limits. Adrenals/Urinary Tract: Adrenal glands are within normal limits. Wedge-shaped low-density in the left lower kidney (coronal image 49), with overlying anterior perirenal inflammatory changes, nonspecific but favoring pyelonephritis. Subcentimeter anterior interpolar right renal cyst (series 2/image 34). No hydronephrosis. Mildly thick-walled bladder, although underdistended. Stomach/Bowel: Stomach is within normal limits. No evidence of bowel obstruction. Normal appendix (series 2/image 58). Left colon is decompressed. No colonic wall thickening or inflammatory changes. Vascular/Lymphatic: No evidence of abdominal aortic aneurysm. No suspicious abdominopelvic lymphadenopathy. Reproductive: Uterus is within normal limits. Bilateral ovaries are within normal limits. Other: No abdominopelvic ascites. Musculoskeletal: Visualized osseous structures are within normal limits. IMPRESSION: Left pyelonephritis. No  evidence of diverticulitis. Normal appendix. Electronically Signed   By: Charline Bills M.D.   On: 04/29/2021 22:37     ____________________________________________   PROCEDURES and INTERVENTIONS  Procedure(s) performed (including Critical Care):  Procedures  Medications  cefTRIAXone (ROCEPHIN) 1 g in sodium chloride 0.9 % 100 mL IVPB (1 g Intravenous New Bag/Given 04/29/21 2256)  lactated ringers bolus 1,000 mL (0 mLs Intravenous Stopped 04/29/21 2256)  ondansetron (ZOFRAN) injection 4 mg (4 mg Intravenous Given 04/29/21 2035)  morphine 4 MG/ML injection 4 mg (4 mg Intravenous Given 04/29/21 2036)  iohexol (OMNIPAQUE) 350 MG/ML injection 100 mL (100 mLs Intravenous Contrast Given 04/29/21 2216)  ketorolac (TORADOL) 30 MG/ML injection 15 mg (15 mg Intravenous Given 04/29/21 2256)    ____________________________________________   MDM / ED COURSE   Healthy 46 year old female presents to the ED with atypical presentation of pyelonephritis, amenable to trial of outpatient management.  Tachycardic and leukocytosis, but no evidence of severe sepsis or instability.  Urine has small leukocytes, 6-10, but is otherwise is unconvincing.  She initially does not tell me about any urinary symptoms with my questioning, but after CT results demonstrate perinephric stranding consistent with pyelonephritis, she does report having foul-smelling urine.  No evidence of diverticulitis, SBO or ureteral stone.  Feeling better with resuscitation and I think it is reasonable for a trial of outpatient management.  We will discharge with 10 days of Keflex, Zofran and return precautions for the ED.  Clinical Course as of 04/29/21 2318  Thu Apr 29, 2021  2256 Reassessed and we discussed CT results. [DS]    Clinical Course User Index [DS] Delton Prairie, MD    ____________________________________________   FINAL CLINICAL IMPRESSION(S) / ED DIAGNOSES  Final diagnoses:  Pyelonephritis     ED Discharge Orders          Ordered    cephALEXin (KEFLEX) 500 MG capsule  4 times daily        04/29/21 2311    ondansetron (ZOFRAN  ODT) 4 MG disintegrating tablet  Every 8 hours PRN        04/29/21 2312             Soul [facility]eney   Note:  This document was prepared using Dragon voice recognition software and may include unintentional dictation errors.    Delton Prairie, MD 04/29/21 [phone]

## 2021-04-29 NOTE — Discharge Instructions (Addendum)
As we discussed, you have evidence of pyelonephritis, or advanced UTI going towards her left kidney.  You are being discharged with Keflex antibiotic to take 4 times daily for the next 10 days.  Please finish all 40 tablets, even if your symptoms are getting better. Use probiotics while taking antibiotics to help prevent diarrhea.  This can be as simple as eating yogurt every day, or getting an over-the-counter probiotic pill. Use Monistat as needed for any yeast infections.  The IV antibiotics that we gave you this evening last about 24 hours, please pick up your Keflex prescription tomorrow and begin taking tomorrow evening.  He will probably only need 1 or 2 doses tomorrow, before starting to take 4 times daily on Saturday.  Please take Tylenol and ibuprofen/Advil for your pain.  It is safe to take them together, or to alternate them every few hours.  Take up to 1000mg  of Tylenol at a time, up to 4 times per day.  Do not take more than 4000 mg of Tylenol in 24 hours.  For ibuprofen, take 400-600 mg, 4-5 times per day.  Use Zofran as needed for nausea and vomiting.  Return to the ED with any worsening symptoms despite these measures.

## 2021-04-29 NOTE — ED Triage Notes (Addendum)
Pt to ER via POV from [facility] clinic with abnormal lab work, reports an elevated white count. Patient reports she has been having LLQ x3 days. Reports pain is constant and dull. No hx of diverticulitis. No fevers at home but has been experiencing chills. No urinary symptoms. Some diarrhea. Reports taking stool softeners at home.   Has been taking bentyl at home with no relief.

## 2021-05-01 LAB — URINE CULTURE: Culture: <10000 — AB

## 2021-05-10 ENCOUNTER — Other Ambulatory Visit: Payer: Self-pay

## 2021-05-10 ENCOUNTER — Emergency Department: Payer: BC Managed Care – PPO

## 2021-05-10 ENCOUNTER — Emergency Department
Admission: EM | Admit: 2021-05-10 | Discharge: 2021-05-10 | Disposition: A | Discharge status: Home / Self Care | Payer: BC Managed Care – PPO | Attending: Emergency Medicine | Admitting: Emergency Medicine

## 2021-05-10 ENCOUNTER — Encounter: Payer: Self-pay | Admitting: Emergency Medicine

## 2021-05-10 DIAGNOSIS — M545 Low back pain, unspecified: Secondary | ICD-10-CM | POA: Diagnosis present

## 2021-05-10 DIAGNOSIS — R112 Nausea with vomiting, unspecified: Secondary | ICD-10-CM | POA: Diagnosis not present

## 2021-05-10 DIAGNOSIS — R109 Unspecified abdominal pain: Secondary | ICD-10-CM | POA: Insufficient documentation

## 2021-05-10 DIAGNOSIS — R197 Diarrhea, unspecified: Secondary | ICD-10-CM | POA: Insufficient documentation

## 2021-05-10 LAB — URINALYSIS, COMPLETE (UACMP) WITH MICROSCOPIC
Bilirubin Urine: NEGATIVE
Glucose, UA: NEGATIVE mg/dL
Ketones, ur: NEGATIVE mg/dL
Leukocytes,Ua: NEGATIVE
Nitrite: NEGATIVE
Protein, ur: NEGATIVE mg/dL
Specific Gravity, Urine: 1.005 (ref 1.005–1.030)
WBC, UA: NONE SEEN WBC/hpf (ref 0–5)
pH: 7 (ref 5.0–8.0)

## 2021-05-10 LAB — COMPREHENSIVE METABOLIC PANEL
ALT: 16 U/L (ref 0–44)
AST: 17 U/L (ref 15–41)
Albumin: 4.5 g/dL (ref 3.5–5.0)
Alkaline Phosphatase: 70 U/L (ref 38–126)
Anion gap: 7 (ref 5–15)
BUN: 6 mg/dL (ref 6–20)
CO2: 27 mmol/L (ref 22–32)
Calcium: 8.9 mg/dL (ref 8.9–10.3)
Chloride: 100 mmol/L (ref 98–111)
Creatinine, Ser: 0.57 mg/dL (ref 0.44–1.00)
GFR, Estimated: >60 mL/min (ref >60)
Glucose, Bld: 107 mg/dL — ABNORMAL HIGH (ref 70–99)
Potassium: 4.1 mmol/L (ref 3.5–5.1)
Sodium: 134 mmol/L — ABNORMAL LOW (ref 135–145)
Total Bilirubin: 0.8 mg/dL (ref 0.3–1.2)
Total Protein: 7.8 g/dL (ref 6.5–8.1)

## 2021-05-10 LAB — POC URINE PREG, ED: Preg Test, Ur: NEGATIVE

## 2021-05-10 LAB — CBC
HCT: 37.8 % (ref 36.0–46.0)
Hemoglobin: 12.9 g/dL (ref 12.0–15.0)
MCH: 29.7 pg (ref 26.0–34.0)
MCHC: 34.1 g/dL (ref 30.0–36.0)
MCV: 87.1 fL (ref 80.0–100.0)
Platelets: 368 10*3/uL (ref 150–400)
RBC: 4.34 MIL/uL (ref 3.87–5.11)
RDW: 12.3 % (ref 11.5–15.5)
WBC: 8 10*3/uL (ref 4.0–10.5)
nRBC: 0 % (ref 0.0–0.2)

## 2021-05-10 LAB — LIPASE, BLOOD: Lipase: 138 U/L — ABNORMAL HIGH (ref 11–51)

## 2021-05-10 MED ORDER — PROCHLORPERAZINE MALEATE 5 MG PO TABS: 5.0000 mg | ORAL_TABLET | Freq: Four times a day (QID) | ORAL | 0 refills | Status: DC | PRN

## 2021-05-10 MED ORDER — PROCHLORPERAZINE EDISYLATE 10 MG/2ML IJ SOLN
10.0000 mg | Freq: Once | INTRAMUSCULAR | Status: AC
  Administered 2021-05-10: 10 mg via INTRAVENOUS
  Filled 2021-05-10: qty 2

## 2021-05-10 MED ORDER — HYDROMORPHONE HCL 1 MG/ML IJ SOLN
0.5000 mg | Freq: Once | INTRAMUSCULAR | Status: AC
  Administered 2021-05-10: 0.5 mg via INTRAVENOUS
  Filled 2021-05-10: qty 1

## 2021-05-10 MED ORDER — SULFAMETHOXAZOLE-TRIMETHOPRIM 800-160 MG PO TABS: 1.0000 | ORAL_TABLET | Freq: Two times a day (BID) | ORAL | 0 refills | Status: AC

## 2021-05-10 MED ORDER — KETOROLAC TROMETHAMINE 30 MG/ML IJ SOLN
15.0000 mg | Freq: Once | INTRAMUSCULAR | Status: AC
  Administered 2021-05-10: 15 mg via INTRAVENOUS
  Filled 2021-05-10: qty 1

## 2021-05-10 MED ORDER — LACTATED RINGERS IV BOLUS
1000.0000 mL | Freq: Once | INTRAVENOUS | Status: AC
  Administered 2021-05-10: 1000 mL via INTRAVENOUS

## 2021-05-10 NOTE — ED Provider Notes (Signed)
[facility] Emergency Department Provider Note  ____________________________________________   Event Date/Time   First MD Initiated Contact with Patient 05/10/21 1132     (approximate)  I have reviewed the triage vital signs and the nursing notes.   HISTORY  Chief Complaint Abdominal Pain   HPI Laura Galloway is a 45 y.o. female with past medical history of anxiety, IBS and recent diagnosis of pyonephritis on 9/15 discharged on Keflex who presents for assessment of some continued left lower back pain associate with some nausea and diarrhea and intermittent nonbloody vomiting.  She denies any right-sided back pain she had abdominal pain on the left lower back pain does radiate around her left flank.  She states she does not have any burning with urination or abnormal vaginal bleeding or discharge is noticed any blood in her stool.  She has no new chest pain, cough, shortness of breath or any other clear associated sick symptoms.  She has been able to take her Keflex but states the Zofran she been taking is not helping her nausea.  No other concerns at this time.  She just does not feel like her usual IBS.         Past Medical History:  Diagnosis Date   Anxiety    IBS (irritable bowel syndrome)    Maternal DVT (deep vein thrombosis), history of 45 years old   Family h/o DVT    Patient Active Problem List   Diagnosis Date Noted   DVT (deep venous thrombosis) (HCC) 03/24/2015   Miscarriage 03/24/2015   History of venous thromboembolism 03/19/2015   [redacted] weeks gestation of pregnancy 03/19/2015    Past Surgical History:  Procedure Laterality Date   CHOLECYSTECTOMY  2013    Prior to Admission medications   Medication Sig Start Date End Date Taking? Authorizing Provider  prochlorperazine (COMPAZINE) 5 MG tablet Take 1 tablet (5 mg total) by mouth every 6 (six) hours as needed for up to 3 days for nausea or vomiting. 05/10/21 05/13/21 Yes Gilles Chiquito,  MD  sulfamethoxazole-trimethoprim (BACTRIM DS) 800-160 MG tablet Take 1 tablet by mouth 2 (two) times daily for 7 days. 05/10/21 05/17/21 Yes Gilles Chiquito, MD  dicyclomine (BENTYL) 20 MG tablet Take by mouth. GI    [provider]  EUCRISA 2 % OINT Apply topically 2 (two) times daily. derm 09/18/19   [provider]  hydrochlorothiazide (MICROZIDE) 12.5 MG capsule TAKE 1 CAPSULE BY MOUTH EVERY DAY 04/16/20   Duanne Limerick, MD  LORazepam (ATIVAN) 0.5 MG tablet Take by mouth.    [provider]  nortriptyline (PAMELOR) 10 MG capsule Take 2 capsules by mouth at bedtime. neurology 09/03/19   [provider]  NURTEC 75 MG TBDP neurology 09/19/19   [provider]  ondansetron (ZOFRAN ODT) 4 MG disintegrating tablet Take 1 tablet (4 mg total) by mouth every 8 (eight) hours as needed for nausea or vomiting. 04/29/21   Delton Prairie, MD  triamcinolone cream (KENALOG) 0.1 % Apply 1 application topically 2 (two) times daily. 09/04/19   Duanne Limerick, MD    Allergies Oxycodone  Family History  Problem Relation Age of Onset   Diabetes Father    Diabetes Paternal Grandmother    Diabetes Paternal Grandfather     Social History Social History   Tobacco Use   Smoking status: Never   Smokeless tobacco: Never  Substance Use Topics   Alcohol use: Yes    Comment: Last use 3  weeks ago   Drug use: No    Review of Systems  Review of Systems  Constitutional:  Negative for chills and fever.  HENT:  Negative for sore throat.   Eyes:  Negative for pain.  Respiratory:  Negative for cough and stridor.   Cardiovascular:  Negative for chest pain.  Gastrointestinal:  Positive for diarrhea, nausea and vomiting.  Genitourinary:  Positive for flank pain. Negative for dysuria.  Musculoskeletal:  Positive for back pain.  Skin:  Negative for rash.  Neurological:  Negative for seizures, loss of consciousness and headaches.  Psychiatric/Behavioral:  Negative for  suicidal ideas.   All other systems reviewed and are negative.    ____________________________________________   PHYSICAL EXAM:  VITAL SIGNS: ED Triage Vitals  Enc Vitals Group     BP 05/10/21 1112 (!) 148/105     Pulse Rate 05/10/21 1112 92     Resp 05/10/21 1112 20     Temp 05/10/21 1112 98.2 F (36.8 C)     Temp Source 05/10/21 1112 Oral     SpO2 05/10/21 1112 100 %     Weight 05/10/21 1113 150 lb (68 kg)     Height 05/10/21 1113 4\' 11"  (1.499 m)     Head Circumference --      Peak Flow --      Pain Score 05/10/21 1113 7     Pain Loc --      Pain Edu? --      Excl. in GC? --    Vitals:   05/10/21 1236 05/10/21 1445  BP: (!) 144/89 (!) 153/107  Pulse: (!) 102 86  Resp: 18   Temp:    SpO2: 100%    Physical Exam Vitals and nursing note reviewed.  Constitutional:      General: She is not in acute distress.    Appearance: She is well-developed.  HENT:     Head: Normocephalic and atraumatic.     Right Ear: External ear normal.     Left Ear: External ear normal.     Nose: Nose normal.  Eyes:     Conjunctiva/sclera: Conjunctivae normal.  Cardiovascular:     Rate and Rhythm: Normal rate and regular rhythm.     Heart sounds: No murmur heard. Pulmonary:     Effort: Pulmonary effort is normal. No respiratory distress.     Breath sounds: Normal breath sounds.  Abdominal:     Palpations: Abdomen is soft.     Tenderness: There is no abdominal tenderness. There is left CVA tenderness. There is no right CVA tenderness.  Musculoskeletal:     Cervical back: Neck supple.  Skin:    General: Skin is warm and dry.     Capillary Refill: Capillary refill takes less than 2 seconds.  Neurological:     Mental Status: She is alert and oriented to person, place, and time.  Psychiatric:        Mood and Affect: Mood normal.     ____________________________________________   LABS (all labs ordered are listed, but only abnormal results are displayed)  Labs Reviewed  LIPASE,  BLOOD - Abnormal; Notable for the following components:      Result Value   Lipase 138 (*)    All other components within normal limits  COMPREHENSIVE METABOLIC PANEL - Abnormal; Notable for the following components:   Sodium 134 (*)    Glucose, Bld 107 (*)    All other components within normal limits  URINALYSIS, COMPLETE (UACMP) WITH MICROSCOPIC -  Abnormal; Notable for the following components:   Color, Urine STRAW (*)    APPearance CLEAR (*)    Hgb urine dipstick SMALL (*)    Bacteria, UA FEW (*)    All other components within normal limits  CBC  POC URINE PREG, ED   ____________________________________________  EKG  ____________________________________________  RADIOLOGY  ED MD interpretation: Renal ultrasound shows no evidence of abscess, hydronephrosis or evidence of pyelonephritis.  Official radiology report(s): No results found.  ____________________________________________   PROCEDURES  Procedure(s) performed (including Critical Care):  Procedures   ____________________________________________   INITIAL IMPRESSION / ASSESSMENT AND PLAN / ED COURSE      Patient presents with above to history exam for assessment of some left low back pain associate with continued nausea vomiting diarrhea over the last couple days it does not seem to be getting better with Keflex after recent being diagnosed with CT proven pyelonephritis.  On arrival she is afebrile and hemodynamically stable.  She does have some left CVA tenderness but no rashes or lesions suggest shingles or cellulitis.  Her abdomen is relatively nontender throughout.  Differential includes kidney stone, persistent pyelonephritis  Renal ultrasound shows no evidence of abscess, hydronephrosis or evidence of pyelonephritis.  I do not believe she is currently septic.  CMP today shows no evidence of electrolyte or metabolic derangements.  No hepatitis or cholestasis or tenderness in the right upper quadrant to  suggest cholecystitis.  Lipase not consistent with pancreatitis.  Pregnancy test is negative.  CBC shows improvement of leukocytosis with previously noted WBC count of 15.8 improved to 8 today.  UA today has some bacteria noted but no other significant findings other than some small hemoglobin.  Also reviewed patient's CT obtained on the 15th which showed unremarkable adnexa and clear convincing evidence of pyelonephritis.  Given patient states her symptoms are the same as it was when she was initially seen with CT showing unremarkable adnexa I have a low suspicion for torsion or cyst at this time.  Patient has been discharged on Keflex which is somewhat atypical for pyonephritis and unfortunately urine culture was not able to grow likely because of agent.  Given there are still some bacteria noted we will place patient on a short course of Bactrim and some Compazine as she states her nausea had improved when receiving this emergency room.  I think she is stable for discharge close outpatient follow-up.  Discharged stable condition.  Strict return precautions advised and discussed.     ____________________________________________   FINAL CLINICAL IMPRESSION(S) / ED DIAGNOSES  Final diagnoses:  Nausea vomiting and diarrhea  Acute left-sided low back pain, unspecified whether sciatica present    Medications  prochlorperazine (COMPAZINE) injection 10 mg (10 mg Intravenous Given 05/10/21 1230)  lactated ringers bolus 1,000 mL (0 mLs Intravenous Stopped 05/10/21 1415)  HYDROmorphone (DILAUDID) injection 0.5 mg (0.5 mg Intravenous Given 05/10/21 1236)  ketorolac (TORADOL) 30 MG/ML injection 15 mg (15 mg Intravenous Given 05/10/21 1420)     ED Discharge Orders          Ordered    prochlorperazine (COMPAZINE) 5 MG tablet  Every 6 hours PRN        05/10/21 1450    sulfamethoxazole-trimethoprim (BACTRIM DS) 800-160 MG tablet  2 times daily        05/10/21 1450             Note:  This  document was prepared using Dragon voice recognition software and may include unintentional dictation  errors.    Gilles Chiquito, MD 05/10/21 1500

## 2021-05-10 NOTE — ED Triage Notes (Signed)
Pt was seen her last week for LLQ pain, she was found to have a kidney infection, put her on antibiotics, she started to feel better than felt bad again. She is having N/V/D. The nausea medication is not helping her.

## 2021-05-11 LAB — URINE CULTURE: Culture: NO GROWTH

## 2021-07-19 ENCOUNTER — Ambulatory Visit: Payer: Self-pay | Admitting: *Deleted

## 2021-07-19 ENCOUNTER — Emergency Department
Admission: EM | Admit: 2021-07-19 | Discharge: 2021-07-19 | Disposition: A | Discharge status: Home / Self Care | Payer: BC Managed Care – PPO | Attending: Emergency Medicine | Admitting: Emergency Medicine

## 2021-07-19 ENCOUNTER — Other Ambulatory Visit: Payer: Self-pay

## 2021-07-19 ENCOUNTER — Emergency Department: Payer: BC Managed Care – PPO

## 2021-07-19 DIAGNOSIS — R079 Chest pain, unspecified: Secondary | ICD-10-CM

## 2021-07-19 DIAGNOSIS — R0602 Shortness of breath: Secondary | ICD-10-CM | POA: Diagnosis not present

## 2021-07-19 DIAGNOSIS — R519 Headache, unspecified: Secondary | ICD-10-CM | POA: Insufficient documentation

## 2021-07-19 LAB — COMPREHENSIVE METABOLIC PANEL
ALT: 24 U/L (ref 0–44)
AST: 21 U/L (ref 15–41)
Albumin: 4.5 g/dL (ref 3.5–5.0)
Alkaline Phosphatase: 72 U/L (ref 38–126)
Anion gap: 5 (ref 5–15)
BUN: 6 mg/dL (ref 6–20)
CO2: 27 mmol/L (ref 22–32)
Calcium: 8.9 mg/dL (ref 8.9–10.3)
Chloride: 104 mmol/L (ref 98–111)
Creatinine, Ser: 0.45 mg/dL (ref 0.44–1.00)
GFR, Estimated: >60 mL/min (ref >60)
Glucose, Bld: 112 mg/dL — ABNORMAL HIGH (ref 70–99)
Potassium: 3.7 mmol/L (ref 3.5–5.1)
Sodium: 136 mmol/L (ref 135–145)
Total Bilirubin: 0.5 mg/dL (ref 0.3–1.2)
Total Protein: 7.6 g/dL (ref 6.5–8.1)

## 2021-07-19 LAB — CBC
HCT: 43.4 % (ref 36.0–46.0)
Hemoglobin: 14.1 g/dL (ref 12.0–15.0)
MCH: 28.3 pg (ref 26.0–34.0)
MCHC: 32.5 g/dL (ref 30.0–36.0)
MCV: 87 fL (ref 80.0–100.0)
Platelets: 332 10*3/uL (ref 150–400)
RBC: 4.99 MIL/uL (ref 3.87–5.11)
RDW: 13 % (ref 11.5–15.5)
WBC: 9.9 10*3/uL (ref 4.0–10.5)
nRBC: 0 % (ref 0.0–0.2)

## 2021-07-19 LAB — D-DIMER, QUANTITATIVE: D-Dimer, Quant: <0.27 ug/mL-FEU (ref 0.00–0.50)

## 2021-07-19 LAB — TROPONIN I (HIGH SENSITIVITY): Troponin I (High Sensitivity): <2 ng/L (ref <18)

## 2021-07-19 MED ORDER — KETOROLAC TROMETHAMINE 30 MG/ML IJ SOLN
15.0000 mg | Freq: Once | INTRAMUSCULAR | Status: DC
  Filled 2021-07-19: qty 1

## 2021-07-19 MED ORDER — KETOROLAC TROMETHAMINE 30 MG/ML IJ SOLN
15.0000 mg | Freq: Once | INTRAMUSCULAR | Status: AC
  Administered 2021-07-19: 15 mg via INTRAVENOUS

## 2021-07-19 MED ORDER — METOCLOPRAMIDE HCL 10 MG PO TABS
10.0000 mg | ORAL_TABLET | Freq: Once | ORAL | Status: AC
  Administered 2021-07-19: 10 mg via ORAL
  Filled 2021-07-19: qty 1

## 2021-07-19 NOTE — Telephone Encounter (Signed)
Reason for Disposition  SEVERE chest pain    Along with nausea, headache and "just don't feel right".  Answer Assessment - Initial Assessment Questions 1. LOCATION: "Where does it hurt?"       Pt calling in with chest pain all night.   Chest is tight.   BP 177/220 3:00 AM this morning.    156/100 something  bottom 120 I have a headache, nausea.   I don't feel right.   Been off the BP pressure medication for a while using natural stuff thinking I could manage it that way. BP this morning 147/101 a hour ago.    2. RADIATION: "Does the pain go anywhere else?" (e.g., into neck, jaw, arms, back)     Not asked referred on to the ED 3. ONSET: "When did the chest pain begin?" (Minutes, hours or days)      During the night and my BP is elevated and I have a headache. 4. PATTERN "Does the pain come and go, or has it been constant since it started?"  "Does it get worse with exertion?"      Constant  I referred her on to the ED due to her symptoms of chest tightness, headache and nausea along with the elevated BP.   Pt agreeable to going. 5. DURATION: "How long does it last" (e.g., seconds, minutes, hours)     Not asked 6. SEVERITY: "How bad is the pain?"  (e.g., Scale 1-10; mild, moderate, or severe)    - MILD (1-3): doesn't interfere with normal activities     - MODERATE (4-7): interferes with normal activities or awakens from sleep    - SEVERE (8-10): excruciating pain, unable to do any normal activities       Not asked 7. CARDIAC RISK FACTORS: "Do you have any history of heart problems or risk factors for heart disease?" (e.g., angina, prior heart attack; diabetes, high blood pressure, high cholesterol, smoker, or strong family history of heart disease)     Hypertension been off her medication for BP trying a natural alternative. 8. PULMONARY RISK FACTORS: "Do you have any history of lung disease?"  (e.g., blood clots in lung, asthma, emphysema, birth control pills)     Not asked 9. CAUSE: "What do  you think is causing the chest pain?"     Not asked 10. OTHER SYMPTOMS: "Do you have any other symptoms?" (e.g., dizziness, nausea, vomiting, sweating, fever, difficulty breathing, cough)       Nausea, headache 11. PREGNANCY: "Is there any chance you are pregnant?" "When was your last menstrual period?"       Not asked  Protocols used: Chest Pain-A-AH

## 2021-07-19 NOTE — ED Notes (Signed)
Pt to ED for intermittent squeezing chest pain ongoing for 3 weeks, kept her up all night last night. Hx blood clots (as child) and this runs in her family. Pt also c/o severe migraine at this time.

## 2021-07-19 NOTE — Telephone Encounter (Signed)
Pt called in c/o chest tightness that started during the night, a headache, and nausea.  Her BP is elevated too   Been off her BP medications because been trying an alternative natural approach for her BP.    "I just don't feel right".    Her husband is going to take her to the ED now.     They were agreeable to this plan.  I sent my information to [facility].

## 2021-07-19 NOTE — Telephone Encounter (Signed)
Pt advised to go to ED for chest pain.  KP

## 2021-07-19 NOTE — ED Provider Notes (Signed)
[facility]  ____________________________________________   Event Date/Time   First MD Initiated Contact with Patient 07/19/21 1143     (approximate)  I have reviewed the triage vital signs and the nursing notes.   HISTORY  Chief Complaint Chest Pain    HPI Laura Galloway is a 45 y.o. female past medical history about DVT as a child and significant family history of DVT, anxiety presents with chest pain and headache.  Patient notes she has had on and off chest pain for a while but started worsening last night.  Described as a tight feeling in the left side of the chest that is nonradiating nonpleuritic and nonexertional.  She has some associated dyspnea denies cough congestion fevers or chills.  Patient also endorses a headache that is typical of her migraines.  Was preceded by her typical aura without associated other visual change numbness or weakness.  The patient denies unilateral leg pain/swelling, hormone use, recent surgery, hx of cancer, prolonged immobilization, or hemoptysis.           Past Medical History:  Diagnosis Date   Anxiety    IBS (irritable bowel syndrome)    Maternal DVT (deep vein thrombosis), history of 47 years old   Family h/o DVT    Patient Active Problem List   Diagnosis Date Noted   DVT (deep venous thrombosis) (HCC) 03/24/2015   Miscarriage 03/24/2015   History of venous thromboembolism 03/19/2015   [redacted] weeks gestation of pregnancy 03/19/2015    Past Surgical History:  Procedure Laterality Date   CHOLECYSTECTOMY  2013    Prior to Admission medications   Medication Sig Start Date End Date Taking? Authorizing Provider  dicyclomine (BENTYL) 20 MG tablet Take by mouth. GI    [provider]  EUCRISA 2 % OINT Apply topically 2 (two) times daily. derm 09/18/19   [provider]  hydrochlorothiazide (MICROZIDE) 12.5 MG capsule TAKE 1 CAPSULE BY MOUTH EVERY DAY 04/16/20   Duanne Limerick, MD  LORazepam  (ATIVAN) 0.5 MG tablet Take by mouth.    [provider]  nortriptyline (PAMELOR) 10 MG capsule Take 2 capsules by mouth at bedtime. neurology 09/03/19   [provider]  NURTEC 75 MG TBDP neurology 09/19/19   [provider]  ondansetron (ZOFRAN ODT) 4 MG disintegrating tablet Take 1 tablet (4 mg total) by mouth every 8 (eight) hours as needed for nausea or vomiting. 04/29/21   Delton Prairie, MD  prochlorperazine (COMPAZINE) 5 MG tablet Take 1 tablet (5 mg total) by mouth every 6 (six) hours as needed for up to 3 days for nausea or vomiting. 05/10/21 05/13/21  Gilles Chiquito, MD  triamcinolone cream (KENALOG) 0.1 % Apply 1 application topically 2 (two) times daily. 09/04/19   Duanne Limerick, MD    Allergies Oxycodone  Family History  Problem Relation Age of Onset   Diabetes Father    Diabetes Paternal Grandmother    Diabetes Paternal Grandfather     Social History Social History   Tobacco Use   Smoking status: Never   Smokeless tobacco: Never  Substance Use Topics   Alcohol use: Yes    Comment: Last use 3 weeks ago   Drug use: No    Review of Systems   Review of Systems  Constitutional:  Negative for chills and fever.  Respiratory:  Positive for chest tightness and shortness of breath.   Cardiovascular:  Positive for chest pain.  Gastrointestinal:  Positive for nausea. Negative  for abdominal pain and vomiting.  Genitourinary:  Negative for dysuria.  All other systems reviewed and are negative.  Physical Exam Updated Vital Signs BP (!) 152/100   Pulse (!) 108   Temp 97.8 F (36.6 C) (Oral)   Resp 20   Ht 4\' 11"  (1.499 m)   Wt 68 kg   LMP 07/12/2021 (Exact Date)   SpO2 100%   BMI 30.30 kg/m   Physical Exam Vitals and nursing note reviewed.  Constitutional:      General: She is not in acute distress.    Appearance: Normal appearance.  HENT:     Head: Normocephalic and atraumatic.  Eyes:     General: No scleral icterus.     Conjunctiva/sclera: Conjunctivae normal.  Cardiovascular:     Rate and Rhythm: Regular rhythm. Tachycardia present.  Pulmonary:     Effort: Pulmonary effort is normal. No respiratory distress.     Breath sounds: No stridor.  Abdominal:     Palpations: Abdomen is soft.     Tenderness: There is no abdominal tenderness. There is no guarding.  Musculoskeletal:        General: No deformity or signs of injury.     Cervical back: Normal range of motion.     Right lower leg: No edema.     Left lower leg: No edema.  Skin:    General: Skin is dry.     Coloration: Skin is not jaundiced or pale.  Neurological:     General: No focal deficit present.     Mental Status: She is alert and oriented to person, place, and time. Mental status is at baseline.  Psychiatric:        Mood and Affect: Mood normal.        Behavior: Behavior normal.     LABS (all labs ordered are listed, but only abnormal results are displayed)  Labs Reviewed  COMPREHENSIVE METABOLIC PANEL - Abnormal; Notable for the following components:      Result Value   Glucose, Bld 112 (*)    All other components within normal limits  CBC  D-DIMER, QUANTITATIVE  POC URINE PREG, ED  TROPONIN I (HIGH SENSITIVITY)   ____________________________________________  EKG  Sinus tachycardia nml axis, nml intervals, no acute ischemic changes  ____________________________________________  RADIOLOGY 07/14/2021, personally viewed and evaluated these images (plain radiographs) as part of my medical decision making, as well as reviewing the written report by the radiologist.  ED MD interpretation:  I reviewed the CXR which does not show any acute cardiopulmonary process      ____________________________________________   PROCEDURES  Procedure(s) performed (including Critical Care):  Procedures   ____________________________________________   INITIAL IMPRESSION / ASSESSMENT AND PLAN / ED COURSE  45 year old  female past medical history of DVT migraine headache presenting with chest pain.  Symptoms have been going on for several days but then worsened last night.  She has some associated dyspnea no infectious symptoms.  She is tachycardic but otherwise vital signs are within normal limits.  Hypertensive but other wise vital signs within normal limits.  She appears well on exam, normal cardiopulmonary exam without evidence of DVT.  Her EKG shows sinus tachycardia without ischemic changes.  Her troponin is negative.  Given patient's tachycardia and history of DVT I did obtain a D-dimer to rule stratify for PE and it is negative.  Chest x-ray does not show any infiltrate or other acute process.  Patient also complaining of migraine headache that  is typical for her.  She was given ibuprofen and Reglan and symptoms significantly improved.  She is stable for discharge.  Clinical Course as of 07/19/21 1320  Mon Jul 19, 2021  1311 D-Dimer, Quant: <0.27 [KM]    Clinical Course User Index [KM] Georga Hacking, MD     ____________________________________________   FINAL CLINICAL IMPRESSION(S) / ED DIAGNOSES  Final diagnoses:  Chest pain, unspecified type     ED Discharge Orders     None        Note:  This document was prepared using Dragon voice recognition software and may include unintentional dictation errors.    Georga Hacking, MD 07/19/21 1320

## 2021-07-19 NOTE — Discharge Instructions (Signed)
Your blood work including the D-dimer and cardiac enzymes are reassuring.  Please follow-up with neurology in regards to your migraine headaches.

## 2021-07-19 NOTE — ED Triage Notes (Signed)
Pt in with co chest pain since last week, states does have some shob at times. Denies any cold symptoms or hx of the same.

## 2021-07-22 ENCOUNTER — Other Ambulatory Visit: Payer: Self-pay

## 2021-07-22 ENCOUNTER — Encounter: Payer: Self-pay | Admitting: Family Medicine

## 2021-07-22 ENCOUNTER — Telehealth: Payer: Self-pay

## 2021-07-22 ENCOUNTER — Ambulatory Visit (INDEPENDENT_AMBULATORY_CARE_PROVIDER_SITE_OTHER): Payer: BC Managed Care – PPO | Admitting: Family Medicine

## 2021-07-22 VITALS — BP 170/128 | HR 88 | Ht 59.0 in | Wt 156.0 lb

## 2021-07-22 DIAGNOSIS — R739 Hyperglycemia, unspecified: Secondary | ICD-10-CM | POA: Diagnosis not present

## 2021-07-22 DIAGNOSIS — Z124 Encounter for screening for malignant neoplasm of cervix: Secondary | ICD-10-CM

## 2021-07-22 DIAGNOSIS — L309 Dermatitis, unspecified: Secondary | ICD-10-CM

## 2021-07-22 DIAGNOSIS — I10 Essential (primary) hypertension: Secondary | ICD-10-CM | POA: Diagnosis not present

## 2021-07-22 DIAGNOSIS — Z91199 Patient's noncompliance with other medical treatment and regimen due to unspecified reason: Secondary | ICD-10-CM

## 2021-07-22 MED ORDER — TRIAMCINOLONE ACETONIDE 0.1 % EX CREA: 1.0000 "application " | TOPICAL_CREAM | Freq: Two times a day (BID) | CUTANEOUS | 0 refills | Status: AC

## 2021-07-22 MED ORDER — HYDROCHLOROTHIAZIDE 12.5 MG PO CAPS
12.5000 mg | ORAL_CAPSULE | Freq: Every day | ORAL | 0 refills | Status: DC
Start: 2021-07-22 — End: 2021-10-18

## 2021-07-22 NOTE — Progress Notes (Signed)
Date:  07/22/2021   Name:  Laura Galloway   DOB:  Oct 24, 1975   MRN:  [id number]   Chief Complaint: Hypertension, Hyperglycemia, and Eczema  Hypertension This is a chronic problem. The current episode started more than 1 year ago. The problem has been gradually worsening since onset. The problem is uncontrolled. Pertinent negatives include no anxiety, blurred vision, chest pain, headaches, malaise/fatigue, neck pain, orthopnea, palpitations, peripheral edema, PND, shortness of breath or sweats. Past treatments include nothing (hctz prescribed). The current treatment provides moderate improvement. There are no compliance problems.  There is no history of angina, kidney disease, CAD/MI, CVA, heart failure, left ventricular hypertrophy, PVD or retinopathy. There is no history of chronic renal disease, a hypertension causing med or renovascular disease.  Hyperglycemia This is a chronic problem. The current episode started in the past 7 days. The problem has been waxing and waning. Pertinent negatives include no abdominal pain, anorexia, arthralgias, chest pain, chills, coughing, fatigue, fever, headaches, myalgias, nausea, neck pain, rash, sore throat or swollen glands. She has tried nothing for the symptoms.   Lab Results  Component Value Date   NA 136 07/19/2021   K 3.7 07/19/2021   CO2 27 07/19/2021   GLUCOSE 112 (H) 07/19/2021   BUN 6 07/19/2021   CREATININE 0.45 07/19/2021   CALCIUM 8.9 07/19/2021   GFRNONAA >60 07/19/2021   Lab Results  Component Value Date   CHOL 219 (H) 10/02/2019   HDL 49 10/02/2019   LDLCALC 157 (H) 10/02/2019   TRIG 76 10/02/2019   Lab Results  Component Value Date   TSH 2.060 10/02/2019   No results found for: HGBA1C Lab Results  Component Value Date   WBC 9.9 07/19/2021   HGB 14.1 07/19/2021   HCT 43.4 07/19/2021   MCV 87.0 07/19/2021   PLT 332 07/19/2021   Lab Results  Component Value Date   ALT 24 07/19/2021   AST 21 07/19/2021    ALKPHOS 72 07/19/2021   BILITOT 0.5 07/19/2021   No results found for: 25OHVITD2, 25OHVITD3, VD25OH   Review of Systems  Constitutional:  Negative for chills, fatigue, fever and malaise/fatigue.  HENT:  Negative for drooling, ear discharge, ear pain and sore throat.   Eyes:  Negative for blurred vision.  Respiratory:  Negative for cough, shortness of breath and wheezing.   Cardiovascular:  Negative for chest pain, palpitations, orthopnea, leg swelling and PND.  Gastrointestinal:  Negative for abdominal pain, anorexia, blood in stool, constipation, diarrhea and nausea.  Endocrine: Negative for polydipsia.  Genitourinary:  Negative for dysuria, frequency, hematuria and urgency.  Musculoskeletal:  Negative for arthralgias, back pain, myalgias and neck pain.  Skin:  Negative for rash.  Allergic/Immunologic: Negative for environmental allergies.  Neurological:  Negative for dizziness and headaches.  Hematological:  Does not bruise/bleed easily.  Psychiatric/Behavioral:  Negative for suicidal ideas. The patient is not nervous/anxious.    Patient Active Problem List   Diagnosis Date Noted   DVT (deep venous thrombosis) (HCC) 03/24/2015   Miscarriage 03/24/2015   History of venous thromboembolism 03/19/2015   [redacted] weeks gestation of pregnancy 03/19/2015    Allergies  Allergen Reactions   Oxycodone Itching    Past Surgical History:  Procedure Laterality Date   CHOLECYSTECTOMY  2013    Social History   Tobacco Use   Smoking status: Never   Smokeless tobacco: Never  Substance Use Topics   Alcohol use: Yes    Comment: Last use 3 weeks ago  Drug use: No     Medication list has been reviewed and updated.  Current Meds  Medication Sig   dicyclomine (BENTYL) 20 MG tablet Take by mouth. GI   EUCRISA 2 % OINT Apply topically 2 (two) times daily. derm   ondansetron (ZOFRAN ODT) 4 MG disintegrating tablet Take 1 tablet (4 mg total) by mouth every 8 (eight) hours as needed for  nausea or vomiting.   triamcinolone cream (KENALOG) 0.1 % Apply 1 application topically 2 (two) times daily.    PHQ 2/9 Scores 07/22/2021 10/02/2019 08/13/2019  PHQ - 2 Score 0 0 1  PHQ- 9 Score - 0 10    GAD 7 : Generalized Anxiety Score 08/13/2019  Nervous, Anxious, on Edge 3  Control/stop worrying 3  Worry too much - different things 3  Trouble relaxing 2  Restless 0  Easily annoyed or irritable 2  Afraid - awful might happen 1  Total GAD 7 Score 14  Anxiety Difficulty Somewhat difficult    BP Readings from Last 3 Encounters:  07/22/21 (!) 170/128  07/19/21 (!) 152/100  05/10/21 (!) 153/107    Physical Exam Vitals and nursing note reviewed.  Constitutional:      Appearance: She is well-developed.  HENT:     Head: Normocephalic.     Right Ear: External ear normal.     Left Ear: External ear normal.  Eyes:     General: Lids are everted, no foreign bodies appreciated. No scleral icterus.       Left eye: No foreign body or hordeolum.     Conjunctiva/sclera: Conjunctivae normal.     Right eye: Right conjunctiva is not injected.     Left eye: Left conjunctiva is not injected.     Pupils: Pupils are equal, round, and reactive to light.  Neck:     Thyroid: No thyromegaly.     Vascular: No JVD.     Trachea: No tracheal deviation.  Cardiovascular:     Rate and Rhythm: Normal rate and regular rhythm.     Heart sounds: Normal heart sounds. No murmur heard.   No friction rub. No gallop.  Pulmonary:     Effort: Pulmonary effort is normal. No respiratory distress.     Breath sounds: Normal breath sounds. No wheezing or rales.  Abdominal:     General: Bowel sounds are normal.     Palpations: Abdomen is soft. There is no mass.     Tenderness: There is no abdominal tenderness. There is no guarding or rebound.  Musculoskeletal:        General: No tenderness. Normal range of motion.     Cervical back: Normal range of motion and neck supple.  Lymphadenopathy:     Cervical: No  cervical adenopathy.  Skin:    General: Skin is warm.     Findings: No rash.  Neurological:     Mental Status: She is alert and oriented to person, place, and time.     Cranial Nerves: No cranial nerve deficit.     Deep Tendon Reflexes: Reflexes normal.  Psychiatric:        Mood and Affect: Mood is not anxious or depressed.    Wt Readings from Last 3 Encounters:  07/22/21 156 lb (70.8 kg)  07/19/21 150 lb (68 kg)  05/10/21 150 lb (68 kg)    BP (!) 170/128   Pulse 88   Ht 4\' 11"  (1.499 m)   Wt 156 lb (70.8 kg)   LMP 07/12/2021 (Exact  Date)   BMI 31.51 kg/m   Assessment and Plan:  1. Essential hypertension Chronic.  Uncontrolled.  Patient remains hypertensive at 170/128.  Patient has an unrealistic expectation of control without medication.  I will give patient a Dash diet but if told her that we need to resume the hydrochlorothiazide at least and we will make a referral to cardiology to see if we can have some reinforcement as to pharmaceutical preparations to control her blood pressure.  Patient does other understand but has been reluctant to medications in the past and so this is going to need to be done on a gradual basis. - Ambulatory referral to Cardiology - hydrochlorothiazide (MICROZIDE) 12.5 MG capsule; Take 1 capsule (12.5 mg total) by mouth daily.  Dispense: 90 capsule; Refill: 0  2. Hyperglycemia New onset.  Patient was noted to have mild elevation of the glucose.  There is been no history of gestational diabetes we will check A1c for current level of overall control of glycemia. - HgB A1c  3. Eczema, unspecified type Chronic.  Controlled.  Stable.  Refill triamcinolone 0.1%. - triamcinolone cream (KENALOG) 0.1 %; Apply 1 application topically 2 (two) times daily.  Dispense: 30 g; Refill: 0  4. Cervical cancer screening Discussed with patient and referral made to gynecology. - Ambulatory referral to Gynecology  5. Noncompliance Patient has understanding about  blood pressure now but it is always felt that she was "nervous "and that this was the reason for her elevated blood pressure.  I have discussed with the patient the importance of bringing blood blood pressure down in normal range.  Patient does have understanding and will resume her hydrochlorothiazide but we will incorporate cardiology to come to reemphasize and maybe help [place] with a gradual regimen that the patient is feeling comfortable with taking as well as control of the blood pressure.

## 2021-07-22 NOTE — Telephone Encounter (Signed)
[facility] referring for Cervical cancer screening. Called and left voicemail for patient to call back to be scheduled.  

## 2021-07-22 NOTE — Patient Instructions (Signed)

## 2021-07-23 LAB — HEMOGLOBIN A1C
Est. average glucose Bld gHb Est-mCnc: 111 mg/dL
Hgb A1c MFr Bld: 5.5 % (ref 4.8–5.6)

## 2021-07-26 DIAGNOSIS — R9431 Abnormal electrocardiogram [ECG] [EKG]: Secondary | ICD-10-CM | POA: Insufficient documentation

## 2021-07-26 DIAGNOSIS — I2089 Other forms of angina pectoris: Secondary | ICD-10-CM | POA: Insufficient documentation

## 2021-07-26 DIAGNOSIS — I208 Other forms of angina pectoris: Secondary | ICD-10-CM | POA: Insufficient documentation

## 2021-07-26 DIAGNOSIS — I10 Essential (primary) hypertension: Secondary | ICD-10-CM | POA: Insufficient documentation

## 2021-07-26 DIAGNOSIS — E782 Mixed hyperlipidemia: Secondary | ICD-10-CM | POA: Insufficient documentation

## 2021-07-30 NOTE — Telephone Encounter (Signed)
Called and left voicemail for patient to call back to be scheduled. 

## 2021-08-03 NOTE — Telephone Encounter (Signed)
Called and left voicemail for patient to call back to be scheduled.  Multiple Attempts to reach patient were unsuccessful. Contacting referring  provider.

## 2021-10-17 ENCOUNTER — Other Ambulatory Visit: Payer: Self-pay | Admitting: Family Medicine

## 2021-10-17 DIAGNOSIS — I10 Essential (primary) hypertension: Secondary | ICD-10-CM

## 2021-11-01 ENCOUNTER — Telehealth: Payer: Self-pay

## 2021-11-01 NOTE — Telephone Encounter (Signed)
Called pt and left message Laura Galloway from [facility] made several attempts to call pt to schedule pap. It looks like she was unsuccessful at that. I have asked that the pt either call Laura Galloway or myself and get updated on pap- I left the numbers to both there and here. ?

## 2021-11-16 ENCOUNTER — Other Ambulatory Visit: Payer: Self-pay | Admitting: Family Medicine

## 2021-11-16 DIAGNOSIS — I10 Essential (primary) hypertension: Secondary | ICD-10-CM

## 2021-11-19 ENCOUNTER — Other Ambulatory Visit: Payer: Self-pay | Admitting: Family Medicine

## 2021-11-19 DIAGNOSIS — L309 Dermatitis, unspecified: Secondary | ICD-10-CM

## 2021-11-22 ENCOUNTER — Ambulatory Visit: Payer: BC Managed Care – PPO | Admitting: Family Medicine

## 2021-11-22 NOTE — Telephone Encounter (Signed)
Requested medication (s) are due for refill today: Yes ? ?Requested medication (s) are on the active medication list: Yes ? ?Last refill:  07/22/21 ? ?Future visit scheduled: Yes ? ?Notes to clinic:  See request. ? ? ? ?Requested Prescriptions  ?Pending Prescriptions Disp Refills  ? triamcinolone cream (KENALOG) 0.1 % [Pharmacy Med Name: TRIAMCINOLONE 0.1% CREAM] 30 g 0  ?  Sig: APPLY TO AFFECTED AREA TWICE A DAY  ?  ? Not Delegated - Dermatology:  Corticosteroids Failed - 11/19/2021  5:41 PM  ?  ?  Failed - This refill cannot be delegated  ?  ?  Passed - Valid encounter within last 12 months  ?  Recent Outpatient Visits   ? ?      ? 4 months ago Essential hypertension  ? [facility] Duanne Limerick, MD  ? 2 years ago Essential hypertension  ? [facility] Duanne Limerick, MD  ? 2 years ago Establishing care with new doctor, encounter for  ? [facility] Duanne Limerick, MD  ? ?  ?  ?Future Appointments   ? ?        ? In 6 months Duanne Limerick, MD [facility]  ? ?  ? ?  ?  ?  ? ?

## 2021-12-11 ENCOUNTER — Other Ambulatory Visit: Payer: Self-pay | Admitting: Family Medicine

## 2021-12-11 DIAGNOSIS — I10 Essential (primary) hypertension: Secondary | ICD-10-CM

## 2021-12-13 NOTE — Telephone Encounter (Signed)
Requested medication (s) are due for refill today: yes ? ?Requested medication (s) are on the active medication list: yes ? ?Last refill:  11/16/21 ? ?Future visit scheduled: in October ? ?Notes to clinic:  pt was a no show - was to f/u ? ? ?Requested Prescriptions  ?Pending Prescriptions Disp Refills  ? hydrochlorothiazide (MICROZIDE) 12.5 MG capsule [Pharmacy Med Name: HYDROCHLOROTHIAZIDE 12.5 MG CP] 90 capsule 1  ?  Sig: TAKE 1 CAPSULE BY MOUTH EVERY DAY  ?  ? Cardiovascular: Diuretics - Thiazide Failed - 12/11/2021  1:34 AM  ?  ?  Failed - Last BP in normal range  ?  BP Readings from Last 1 Encounters:  ?07/22/21 (!) 170/128  ?  ?  ?  ?  Passed - Cr in normal range and within 180 days  ?  Creatinine, Ser  ?Date Value Ref Range Status  ?07/19/2021 0.45 0.44 - 1.00 mg/dL Final  ?  ?  ?  ?  Passed - K in normal range and within 180 days  ?  Potassium  ?Date Value Ref Range Status  ?07/19/2021 3.7 3.5 - 5.1 mmol/L Final  ?  ?  ?  ?  Passed - Na in normal range and within 180 days  ?  Sodium  ?Date Value Ref Range Status  ?07/19/2021 136 135 - 145 mmol/L Final  ?10/02/2019 137 134 - 144 mmol/L Final  ?  ?  ?  ?  Passed - Valid encounter within last 6 months  ?  Recent Outpatient Visits   ? ?      ? 4 months ago Essential hypertension  ? [facility] Duanne Limerick, MD  ? 2 years ago Essential hypertension  ? [facility] Duanne Limerick, MD  ? 2 years ago Establishing care with new doctor, encounter for  ? [facility] Duanne Limerick, MD  ? ?  ?  ?Future Appointments   ? ?        ? In 5 months Duanne Limerick, MD [facility]  ? ?  ? ? ?  ?  ?  ? ? ? ? ?

## 2021-12-14 ENCOUNTER — Other Ambulatory Visit: Payer: Self-pay | Admitting: Family Medicine

## 2021-12-14 DIAGNOSIS — I10 Essential (primary) hypertension: Secondary | ICD-10-CM

## 2021-12-14 NOTE — Telephone Encounter (Signed)
Refilled 12/13/2021 #30 0 refills. ?Requested Prescriptions  ?Pending Prescriptions Disp Refills  ?? hydrochlorothiazide (MICROZIDE) 12.5 MG capsule [Pharmacy Med Name: HYDROCHLOROTHIAZIDE 12.5 MG CP] 30 capsule 0  ?  Sig: TAKE 1 CAPSULE BY MOUTH EVERY DAY  ?  ? Cardiovascular: Diuretics - Thiazide Failed - 12/14/2021 10:30 AM  ?  ?  Failed - Last BP in normal range  ?  BP Readings from Last 1 Encounters:  ?07/22/21 (!) 170/128  ?   ?  ?  Passed - Cr in normal range and within 180 days  ?  Creatinine, Ser  ?Date Value Ref Range Status  ?07/19/2021 0.45 0.44 - 1.00 mg/dL Final  ?   ?  ?  Passed - K in normal range and within 180 days  ?  Potassium  ?Date Value Ref Range Status  ?07/19/2021 3.7 3.5 - 5.1 mmol/L Final  ?   ?  ?  Passed - Na in normal range and within 180 days  ?  Sodium  ?Date Value Ref Range Status  ?07/19/2021 136 135 - 145 mmol/L Final  ?10/02/2019 137 134 - 144 mmol/L Final  ?   ?  ?  Passed - Valid encounter within last 6 months  ?  Recent Outpatient Visits   ?      ? 4 months ago Essential hypertension  ? [facility] Juline Patch, MD  ? 2 years ago Essential hypertension  ? [facility] Juline Patch, MD  ? 2 years ago Establishing care with new doctor, encounter for  ? [facility] Juline Patch, MD  ?  ?  ?Future Appointments   ?        ? In 5 months Juline Patch, MD [facility], [place]  ?  ? ?  ?  ?  ? ?

## 2022-01-03 ENCOUNTER — Encounter: Payer: Self-pay | Admitting: Medical Oncology

## 2022-01-03 ENCOUNTER — Emergency Department: Payer: BC Managed Care – PPO

## 2022-01-03 ENCOUNTER — Emergency Department
Admission: EM | Admit: 2022-01-03 | Discharge: 2022-01-04 | Disposition: A | Discharge status: Home / Self Care | Payer: BC Managed Care – PPO | Attending: Emergency Medicine | Admitting: Emergency Medicine

## 2022-01-03 DIAGNOSIS — R197 Diarrhea, unspecified: Secondary | ICD-10-CM | POA: Insufficient documentation

## 2022-01-03 DIAGNOSIS — R0602 Shortness of breath: Secondary | ICD-10-CM | POA: Diagnosis not present

## 2022-01-03 DIAGNOSIS — R1010 Upper abdominal pain, unspecified: Secondary | ICD-10-CM | POA: Diagnosis not present

## 2022-01-03 DIAGNOSIS — I10 Essential (primary) hypertension: Secondary | ICD-10-CM | POA: Diagnosis not present

## 2022-01-03 DIAGNOSIS — R110 Nausea: Secondary | ICD-10-CM | POA: Insufficient documentation

## 2022-01-03 DIAGNOSIS — D72829 Elevated white blood cell count, unspecified: Secondary | ICD-10-CM | POA: Insufficient documentation

## 2022-01-03 DIAGNOSIS — R0789 Other chest pain: Secondary | ICD-10-CM | POA: Insufficient documentation

## 2022-01-03 DIAGNOSIS — R079 Chest pain, unspecified: Secondary | ICD-10-CM

## 2022-01-03 LAB — URINALYSIS, ROUTINE W REFLEX MICROSCOPIC
Bilirubin Urine: NEGATIVE
Glucose, UA: NEGATIVE mg/dL
Ketones, ur: NEGATIVE mg/dL
Leukocytes,Ua: NEGATIVE
Nitrite: NEGATIVE
Protein, ur: NEGATIVE mg/dL
Specific Gravity, Urine: 1.02 (ref 1.005–1.030)
pH: 5 (ref 5.0–8.0)

## 2022-01-03 LAB — CBC WITH DIFFERENTIAL/PLATELET
Abs Immature Granulocytes: 0.04 10*3/uL (ref 0.00–0.07)
Basophils Absolute: 0.1 10*3/uL (ref 0.0–0.1)
Basophils Relative: 1 %
Eosinophils Absolute: 0.1 10*3/uL (ref 0.0–0.5)
Eosinophils Relative: 1 %
HCT: 39.8 % (ref 36.0–46.0)
Hemoglobin: 13.2 g/dL (ref 12.0–15.0)
Immature Granulocytes: 0 %
Lymphocytes Relative: 27 %
Lymphs Abs: 3.2 10*3/uL (ref 0.7–4.0)
MCH: 29.1 pg (ref 26.0–34.0)
MCHC: 33.2 g/dL (ref 30.0–36.0)
MCV: 87.9 fL (ref 80.0–100.0)
Monocytes Absolute: 0.8 10*3/uL (ref 0.1–1.0)
Monocytes Relative: 7 %
Neutro Abs: 7.5 10*3/uL (ref 1.7–7.7)
Neutrophils Relative %: 64 %
Platelets: 326 10*3/uL (ref 150–400)
RBC: 4.53 MIL/uL (ref 3.87–5.11)
RDW: 11.9 % (ref 11.5–15.5)
WBC: 11.7 10*3/uL — ABNORMAL HIGH (ref 4.0–10.5)
nRBC: 0 % (ref 0.0–0.2)

## 2022-01-03 LAB — TROPONIN I (HIGH SENSITIVITY)
Troponin I (High Sensitivity): <2 ng/L (ref <18)
Troponin I (High Sensitivity): 3 ng/L (ref <18)

## 2022-01-03 LAB — COMPREHENSIVE METABOLIC PANEL
ALT: 13 U/L (ref 0–44)
AST: 17 U/L (ref 15–41)
Albumin: 4.1 g/dL (ref 3.5–5.0)
Alkaline Phosphatase: 59 U/L (ref 38–126)
Anion gap: 9 (ref 5–15)
BUN: 8 mg/dL (ref 6–20)
CO2: 29 mmol/L (ref 22–32)
Calcium: 9.1 mg/dL (ref 8.9–10.3)
Chloride: 99 mmol/L (ref 98–111)
Creatinine, Ser: 0.66 mg/dL (ref 0.44–1.00)
GFR, Estimated: >60 mL/min (ref >60)
Glucose, Bld: 107 mg/dL — ABNORMAL HIGH (ref 70–99)
Potassium: 3.3 mmol/L — ABNORMAL LOW (ref 3.5–5.1)
Sodium: 137 mmol/L (ref 135–145)
Total Bilirubin: 0.4 mg/dL (ref 0.3–1.2)
Total Protein: 7.2 g/dL (ref 6.5–8.1)

## 2022-01-03 LAB — LIPASE, BLOOD: Lipase: 46 U/L (ref 11–51)

## 2022-01-03 LAB — POC URINE PREG, ED: Preg Test, Ur: NEGATIVE

## 2022-01-03 NOTE — ED Triage Notes (Signed)
Pt reports that she began yesterday having left sided chest pain that is under breast and under axilla. Pt also states that she has been having epigastric pain that radiates into her back with diarrhea.

## 2022-01-03 NOTE — ED Provider Triage Note (Signed)
Emergency Medicine Provider Triage Evaluation Note  Laura Galloway , a 46 y.o. female  was evaluated in triage.  Pt complains of central/left-sided chest pain, epigastric pain.  Patient has had symptoms x1 day.  Patient states that she does have a history of angina, is on propanolol for same.  Patient denies any trauma to the chest, no recent illnesses.  Pain is constant, does not seem to be pleuritic.  Patient has had some nausea as well as diarrhea accompanying the symptoms.  No urinary changes..  Review of Systems  Positive: Chest pain, epigastric pain, nausea, diarrhea Negative: Fevers, chills, shortness of breath, cough, emesis, urinary changes  Physical Exam  BP (!) 166/106 (BP Location: Left Arm)   Pulse (!) 109   Temp 98.6 F (37 C) (Oral)   Resp 18   Wt 70 kg   LMP 12/20/2021 (Approximate)   SpO2 95%   BMI 31.17 kg/m  Gen:   Awake, no distress   Resp:  Normal effort  MSK:   Moves extremities without difficulty  Other:  Patient is tender in the epigastric region.  No significant murmurs, rubs, gallops  Medical Decision Making  Medically screening exam initiated at 5:26 PM.  Appropriate orders placed.  Laura Galloway was informed that the remainder of the evaluation will be completed by another provider, this initial triage assessment does not replace that evaluation, and the importance of remaining in the ED until their evaluation is complete.  Patient arrives with central left-sided chest pain as well as epigastric pain, nausea and diarrhea.  Symptoms x1 day.  Does have a history of angina.  At this time patient will have labs, chest x-ray, urinalysis   Laura Patches, PA-C 01/03/22 1726

## 2022-01-04 ENCOUNTER — Emergency Department: Payer: BC Managed Care – PPO

## 2022-01-04 LAB — MAGNESIUM: Magnesium: 2.1 mg/dL (ref 1.7–2.4)

## 2022-01-04 LAB — TSH: TSH: 1.962 u[IU]/mL (ref 0.350–4.500)

## 2022-01-04 MED ORDER — SODIUM CHLORIDE 0.9 % IV BOLUS (SEPSIS)
1000.0000 mL | Freq: Once | INTRAVENOUS | Status: AC
  Administered 2022-01-04: 1000 mL via INTRAVENOUS

## 2022-01-04 MED ORDER — PROPRANOLOL HCL 20 MG PO TABS
20.0000 mg | ORAL_TABLET | Freq: Once | ORAL | Status: AC
  Administered 2022-01-04: 20 mg via ORAL
  Filled 2022-01-04: qty 1

## 2022-01-04 MED ORDER — ONDANSETRON HCL 4 MG/2ML IJ SOLN
4.0000 mg | Freq: Once | INTRAMUSCULAR | Status: AC
  Administered 2022-01-04: 4 mg via INTRAVENOUS
  Filled 2022-01-04: qty 2

## 2022-01-04 MED ORDER — MORPHINE SULFATE (PF) 4 MG/ML IV SOLN
4.0000 mg | Freq: Once | INTRAVENOUS | Status: DC
Start: 2022-01-04 — End: 2022-01-04
  Filled 2022-01-04: qty 1

## 2022-01-04 MED ORDER — IOHEXOL 350 MG/ML SOLN
75.0000 mL | Freq: Once | INTRAVENOUS | Status: AC | PRN
  Administered 2022-01-04: 75 mL via INTRAVENOUS

## 2022-01-04 NOTE — ED Provider Notes (Signed)
[facility] Provider Note    Event Date/Time   First MD Initiated Contact with Patient 01/04/22 0009     (approximate)   History   Chest Pain and Abdominal Pain   HPI  Laura Galloway is a 46 y.o. female with history of anxiety, irritable bowel syndrome, DVT not on anticoagulation with strong family history of blood clots who presents to the emergency department with complaints of central and left-sided chest pain that she describes as a squeezing that radiates into her upper back that started yesterday.  She has had shortness of breath and nausea.  No fevers or cough.  No lower extremity swelling or pain.  States she has also had some upper abdominal discomfort and diarrhea.  He has had a previous cholecystectomy.  States she has had similar symptoms before and was diagnosed with angina and was seen by cardiology but states she never followed up.  Looks like her last appointment was with Dr. Gwen Pounds in December 2022.  She denies any aggravating or relieving factors to her pain.  States it waxes and wanes.  States her husband drove her here to the emergency department.   History provided by patient.    Past Medical History:  Diagnosis Date   Anxiety    IBS (irritable bowel syndrome)    Maternal DVT (deep vein thrombosis), history of 48 years old   Family h/o DVT    Past Surgical History:  Procedure Laterality Date   CHOLECYSTECTOMY  2013    MEDICATIONS:  Prior to Admission medications   Medication Sig Start Date End Date Taking? Authorizing Provider  dicyclomine (BENTYL) 20 MG tablet Take by mouth. GI    [provider]  EUCRISA 2 % OINT Apply topically 2 (two) times daily. derm 09/18/19   [provider]  hydrochlorothiazide (MICROZIDE) 12.5 MG capsule TAKE 1 CAPSULE BY MOUTH EVERY DAY 12/13/21   Duanne Limerick, MD  nortriptyline (PAMELOR) 10 MG capsule Take 2 capsules by mouth at bedtime. neurology Patient not taking: Reported on  07/22/2021 09/03/19   [provider]  NURTEC 75 MG TBDP neurology Patient not taking: Reported on 07/22/2021 09/19/19   [provider]  ondansetron (ZOFRAN ODT) 4 MG disintegrating tablet Take 1 tablet (4 mg total) by mouth every 8 (eight) hours as needed for nausea or vomiting. 04/29/21   Delton Prairie, MD  triamcinolone cream (KENALOG) 0.1 % Apply 1 application topically 2 (two) times daily. 07/22/21   Duanne Limerick, MD    Physical Exam   Triage Vital Signs: ED Triage Vitals  Enc Vitals Group     BP 01/03/22 1722 (!) 166/106     Pulse Rate 01/03/22 1722 (!) 109     Resp 01/03/22 1722 18     Temp 01/03/22 1722 98.6 F (37 C)     Temp Source 01/03/22 1722 Oral     SpO2 01/03/22 1722 95 %     Weight 01/03/22 1724 154 lb 5.2 oz (70 kg)     Height --      Head Circumference --      Peak Flow --      Pain Score 01/03/22 1722 7     Pain Loc --      Pain Edu? --      Excl. in GC? --     Most recent vital signs: Vitals:   01/04/22 0109 01/04/22 0130  BP: 134/87 124/89  Pulse: 87 81  Resp: 18 14  Temp:    SpO2: 98% 97%    CONSTITUTIONAL: Alert and oriented and responds appropriately to questions. Well-appearing; well-nourished HEAD: Normocephalic, atraumatic EYES: Conjunctivae clear, pupils appear equal, sclera nonicteric ENT: normal nose; moist mucous membranes NECK: Supple, normal ROM CARD: RRR; S1 and S2 appreciated; no murmurs, no clicks, no rubs, no gallops RESP: Normal chest excursion without splinting or tachypnea; breath sounds clear and equal bilaterally; no wheezes, no rhonchi, no rales, no hypoxia or respiratory distress, speaking full sentences ABD/GI: Normal bowel sounds; non-distended; soft, non-tender, no rebound, no guarding, no peritoneal signs, negative Murphy sign BACK: The back appears normal EXT: Normal ROM in all joints; no deformity noted, no edema; no cyanosis, no calf tenderness or calf swelling SKIN: Normal color for age and race; warm;  no rash on exposed skin NEURO: Moves all extremities equally, normal speech PSYCH: The patient's mood and manner are appropriate.   ED Results / Procedures / Treatments   LABS: (all labs ordered are listed, but only abnormal results are displayed) Labs Reviewed  COMPREHENSIVE METABOLIC PANEL - Abnormal; Notable for the following components:      Result Value   Potassium 3.3 (*)    Glucose, Bld 107 (*)    All other components within normal limits  CBC WITH DIFFERENTIAL/PLATELET - Abnormal; Notable for the following components:   WBC 11.7 (*)    All other components within normal limits  URINALYSIS, ROUTINE W REFLEX MICROSCOPIC - Abnormal; Notable for the following components:   Color, Urine YELLOW (*)    APPearance CLOUDY (*)    Hgb urine dipstick MODERATE (*)    Bacteria, UA RARE (*)    All other components within normal limits  LIPASE, BLOOD  MAGNESIUM  TSH  POC URINE PREG, ED  TROPONIN I (HIGH SENSITIVITY)  TROPONIN I (HIGH SENSITIVITY)     EKG:  EKG Interpretation  Date/Time:  Monday Jan 03 2022 17:19:02 EDT Ventricular Rate:  101 PR Interval:  188 QRS Duration: 70 QT Interval:  344 QTC Calculation: 446 R Axis:   51 Text Interpretation: Sinus tachycardia Otherwise normal ECG When compared with ECG of 19-Jul-2021 09:04, Nonspecific T wave abnormality now evident in Anterior leads Confirmed by Rochele Raring [phone]) on 01/04/2022 12:15:13 AM         RADIOLOGY: My personal review and interpretation of imaging: Chest x-ray clear.  CTA of the chest shows no PE.  I have personally reviewed all radiology reports.   DG Chest 2 View  Result Date: 01/03/2022 CLINICAL DATA:  Chest pain radiating down the left arm. Upper gastric pain. EXAM: CHEST - 2 VIEW COMPARISON:  Chest two views 07/19/2021 FINDINGS: Cardiac silhouette and mediastinal contours are within normal limits. The lungs are clear. No pleural effusion or pneumothorax. Mild dextrocurvature of the thoracolumbar  junction. Cholecystectomy clips. IMPRESSION: No active cardiopulmonary disease. Electronically Signed   By: Neita Garnet M.D.   On: 01/03/2022 17:43   CT Angio Chest PE W and/or Wo Contrast  Result Date: 01/04/2022 CLINICAL DATA:  Concern for pulmonary embolism. EXAM: CT ANGIOGRAPHY CHEST WITH CONTRAST TECHNIQUE: Multidetector CT imaging of the chest was performed using the standard protocol during bolus administration of intravenous contrast. Multiplanar CT image reconstructions and MIPs were obtained to evaluate the vascular anatomy. RADIATION DOSE REDUCTION: This exam was performed according to the departmental dose-optimization program which includes automated exposure control, adjustment of the mA and/or kV according to patient size and/or use of iterative reconstruction technique. CONTRAST:  63mL OMNIPAQUE IOHEXOL 350  MG/ML SOLN COMPARISON:  Chest radiograph dated 01/03/2022. FINDINGS: Cardiovascular: There is no cardiomegaly or pericardial effusion. The thoracic aorta is unremarkable. The origins of the great vessels of the aortic arch appear patent as visualized. Evaluation of the pulmonary arteries is limited due to respiratory motion artifact. No pulmonary artery embolus identified. Mediastinum/Nodes: No hilar or mediastinal adenopathy. The esophagus is grossly unremarkable. No mediastinal fluid collection. Lungs/Pleura: The lungs are clear. There is no pleural effusion or pneumothorax. The central airways are patent. Upper Abdomen: Cholecystectomy. Musculoskeletal: No chest wall abnormality. No acute or significant osseous findings. Review of the MIP images confirms the above findings. IMPRESSION: No acute intrathoracic pathology. No CT evidence of pulmonary artery embolus. Electronically Signed   By: Elgie Collard M.D.   On: 01/04/2022 01:14     PROCEDURES:  Critical Care performed: No      .1-3 Lead EKG Interpretation Performed by: Tabita Corbo, Layla Maw, DO Authorized by: Johna Kearl, Layla Maw,  DO     Interpretation: abnormal     ECG rate:  111   ECG rate assessment: tachycardic     Rhythm: sinus tachycardia     Ectopy: none     Conduction: normal      IMPRESSION / MDM / ASSESSMENT AND PLAN / ED COURSE  I reviewed the triage vital signs and the nursing notes.    Patient here with left-sided chest pain, shortness of breath and nausea.  She is tachycardic here.  The patient is on the cardiac monitor to evaluate for evidence of arrhythmia and/or significant heart rate changes.   DIFFERENTIAL DIAGNOSIS (includes but not limited to):   ACS, PE, dissection, pneumonia, pneumothorax, CHF, GERD, esophagitis, esophageal spasm, chest wall pain, anxiety   PLAN: We will obtain CBC, CMP, lipase, troponin x2, chest x-ray.  We will give her nightly dose of propanolol.  Will give IV fluids, morphine, Zofran.  I feel she would likely need a CTA of her chest to rule out PE given her history and family history of clots.   MEDICATIONS GIVEN IN ED: Medications  morphine (PF) 4 MG/ML injection 4 mg (4 mg Intravenous Not Given 01/04/22 0045)  sodium chloride 0.9 % bolus 1,000 mL (1,000 mLs Intravenous New Bag/Given 01/04/22 0040)  ondansetron (ZOFRAN) injection 4 mg (4 mg Intravenous Given 01/04/22 0045)  propranolol (INDERAL) tablet 20 mg (20 mg Oral Given 01/04/22 0047)  iohexol (OMNIPAQUE) 350 MG/ML injection 75 mL (75 mLs Intravenous Contrast Given 01/04/22 0054)     ED COURSE: Patient's labs show slight leukocytosis of 11.7.  Normal electrolytes, renal function, LFTs and lipase.  Troponin x2 negative.  Pregnancy test negative.  Chest x-ray reviewed and interpreted by myself and radiologist and shows no acute abnormality.  We will proceed with CTA of her chest for further evaluation.  EKG shows no new ischemic abnormality.   TSH, magnesium normal.  CTA of the chest reviewed and interpreted by myself radiology and shows no PE.  Heart rate now in the 80s and she reports feeling better.  She  has no significant risk factors for ACS other than hypertension and age.  Given 2 negative troponins, I feel she is safe for discharge with close follow-up with Dr. Gwen Pounds who is she has already established care with.  Patient is comfortable with this plan.   CONSULTS: Admission considered but given patient is low risk for ACS and has 2 negative troponins today and a nonischemic EKG, CTA of the chest that shows no PE, will discharge with  close outpatient follow-up.   OUTSIDE RECORDS REVIEWED: Reviewed patient's last cardiology note with Dr. Gwen PoundsKowalski on 07/26/2021.         FINAL CLINICAL IMPRESSION(S) / ED DIAGNOSES   Final diagnoses:  Chest pain, unspecified type     Rx / DC Orders   ED Discharge Orders     None        Note:  This document was prepared using Dragon voice recognition software and may include unintentional dictation errors.   Skylah Delauter, Layla MawKristen N, DO 01/04/22 [phone]

## 2022-01-04 NOTE — ED Notes (Signed)
Pt to CT at this time.

## 2022-01-04 NOTE — ED Notes (Signed)
Pt return from CT.

## 2022-05-24 ENCOUNTER — Ambulatory Visit: Payer: BC Managed Care – PPO | Admitting: Family Medicine

## 2022-11-23 ENCOUNTER — Ambulatory Visit: Payer: BC Managed Care – PPO | Admitting: Family Medicine

## 2023-05-25 ENCOUNTER — Ambulatory Visit: Payer: BC Managed Care – PPO | Admitting: Family Medicine

## 2024-04-19 IMAGING — CT CT ANGIO CHEST
2 of 7 series · 18 of 46 positions shown · IV contrast (APPLIED)
Comparison: Chest radiograph dated 01/03/2022.

CLINICAL DATA: Concern for pulmonary embolism.

EXAM:
CT ANGIOGRAPHY CHEST WITH CONTRAST
TECHNIQUE: Multidetector CT imaging of the chest was performed using the
standard protocol during bolus administration of intravenous
contrast. Multiplanar CT image reconstructions and MIPs were
obtained to evaluate the vascular anatomy.

[Series 6: thins · axial · 0.70mm/px · z∈[-465,-257]mm · 16 of 236 slices shown]
[im 14/236  lung]
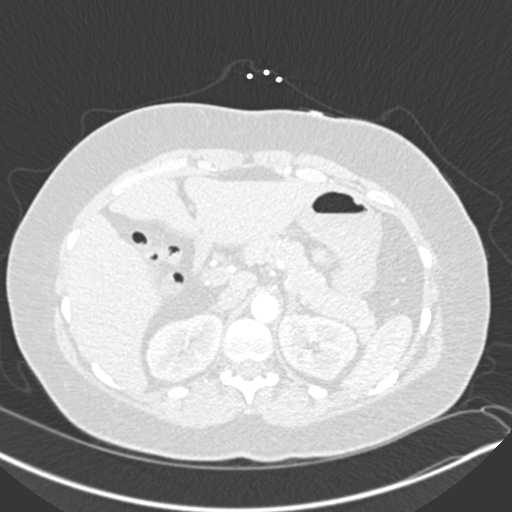
[im 27/236  soft-tissue]
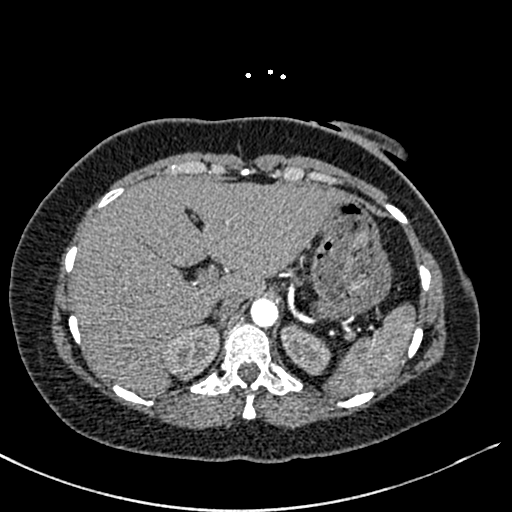
[im 40/236  lung]
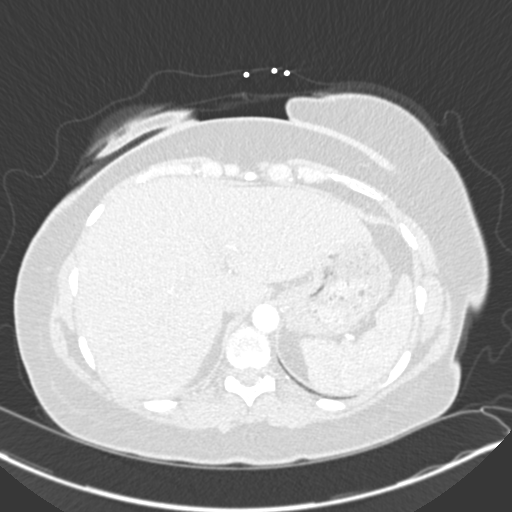
[im 53/236  soft-tissue]
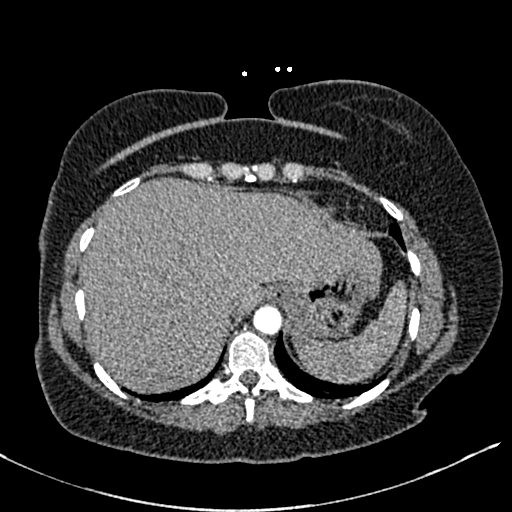
[im 66/236  lung]
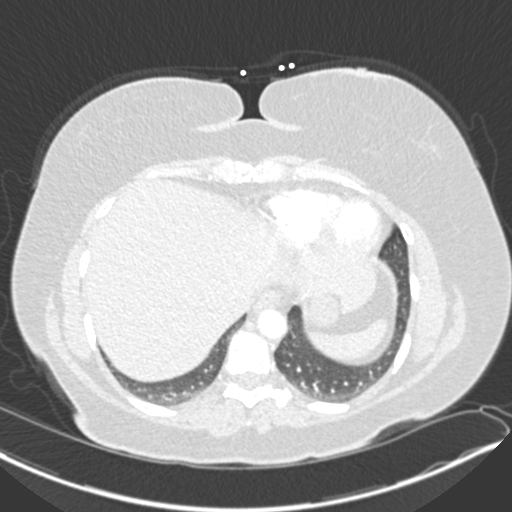
[im 79/236  soft-tissue]
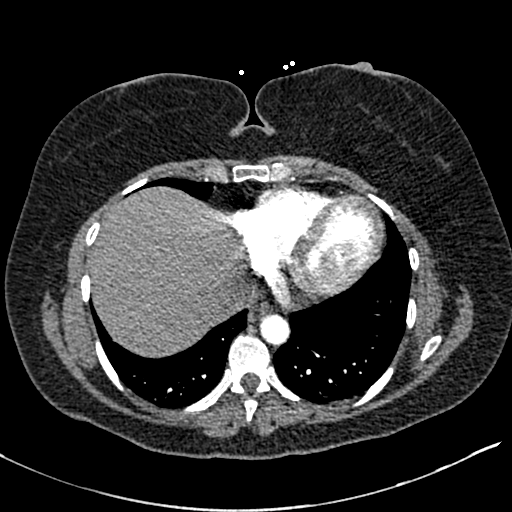
[im 92/236  lung]
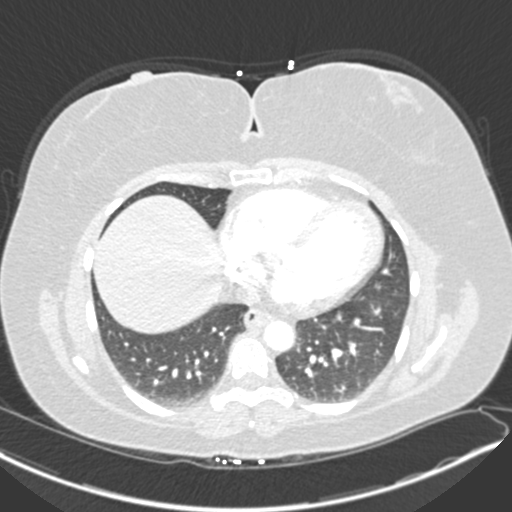
[im 105/236  soft-tissue]
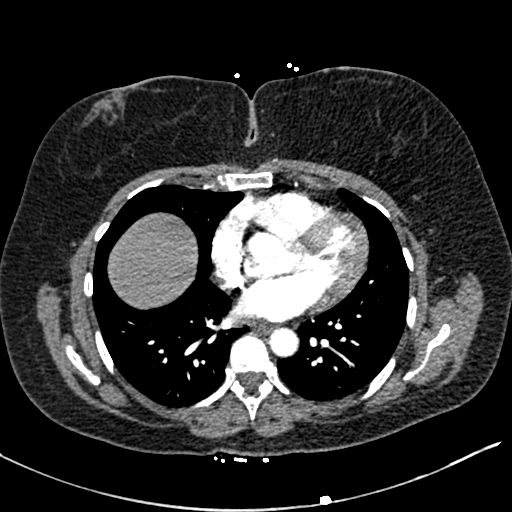
[im 131/236  lung]
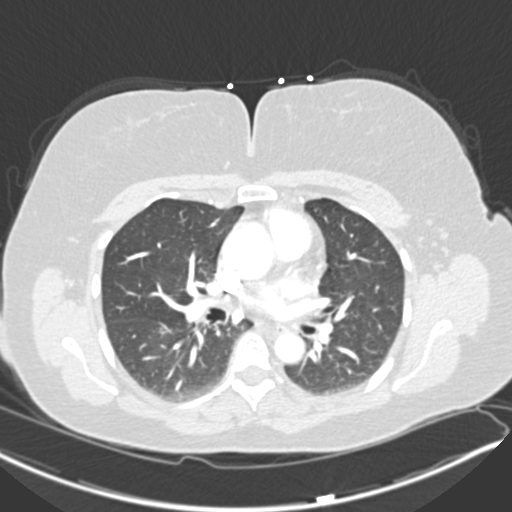
[im 144/236  soft-tissue]
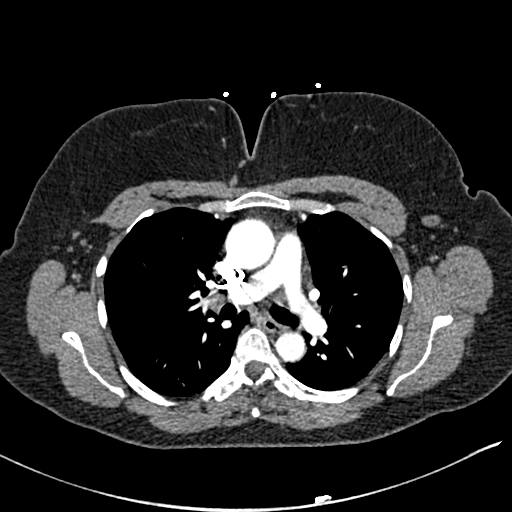
[im 157/236  lung]
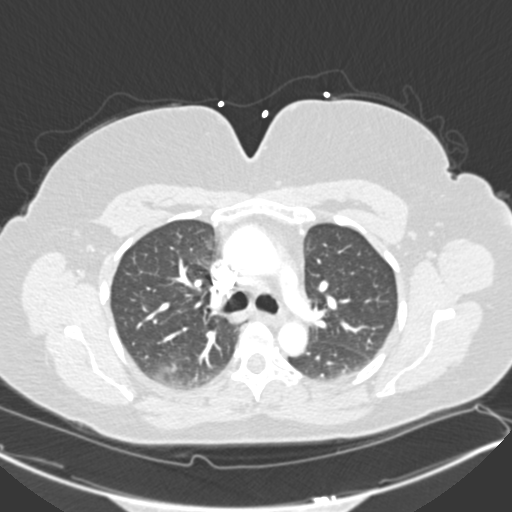
[im 170/236  soft-tissue]
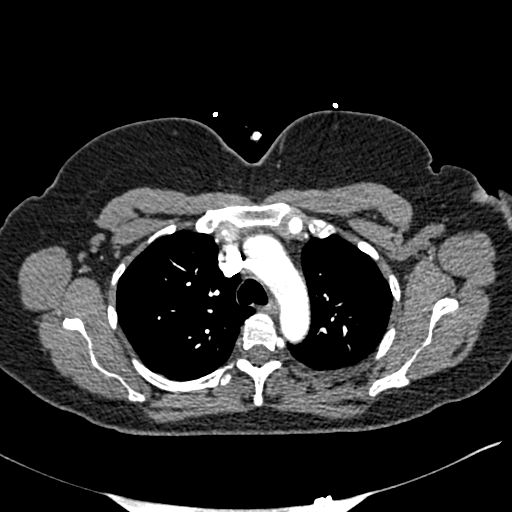
[im 183/236  lung]
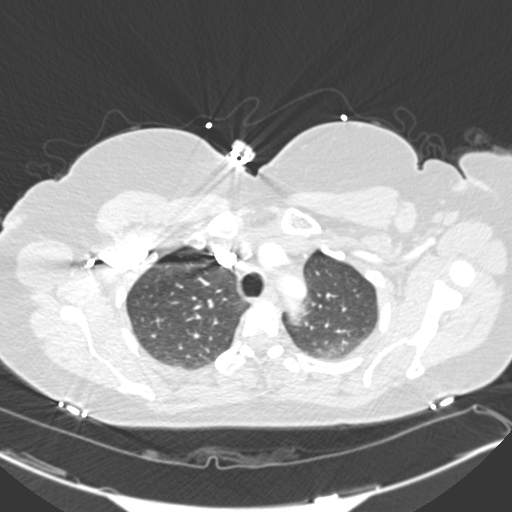
[im 196/236  soft-tissue]
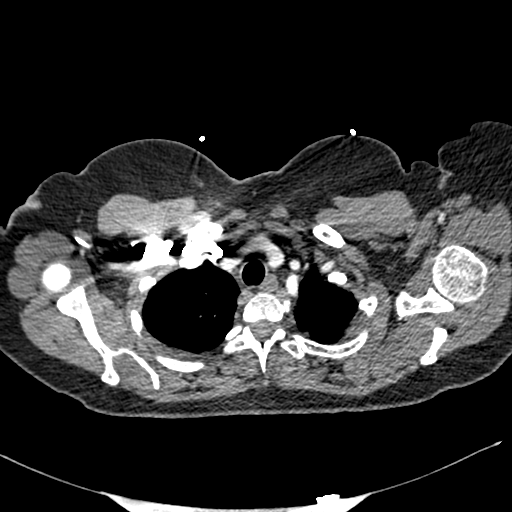
[im 209/236  lung]
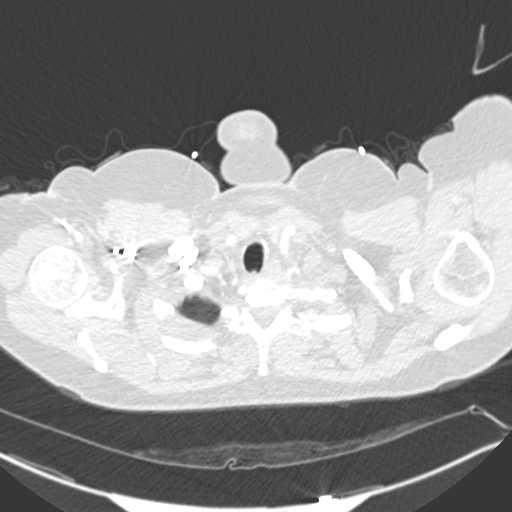
[im 222/236  soft-tissue]
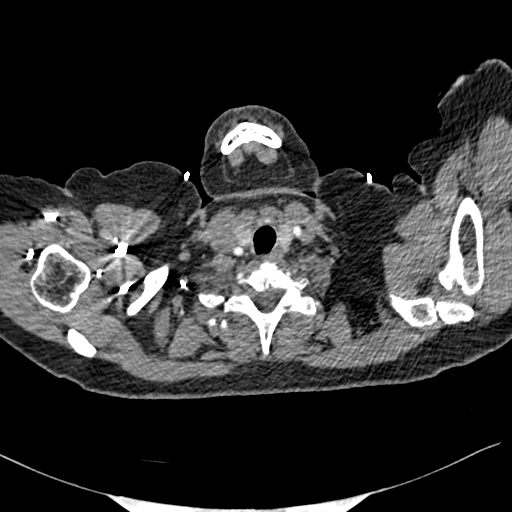

[Series 8: coronal mpr · coronal · 0.52mm/px · 2 of 68 slices shown]
[im 23/68  soft-tissue]
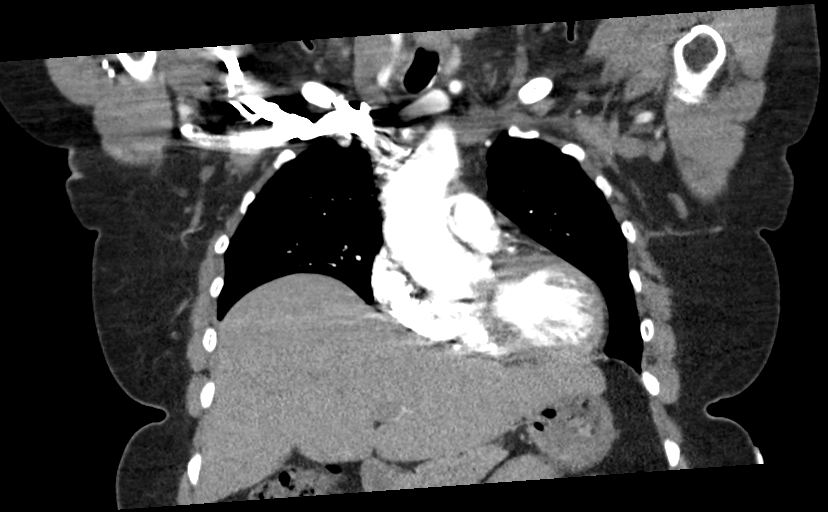
[im 45/68  soft-tissue]
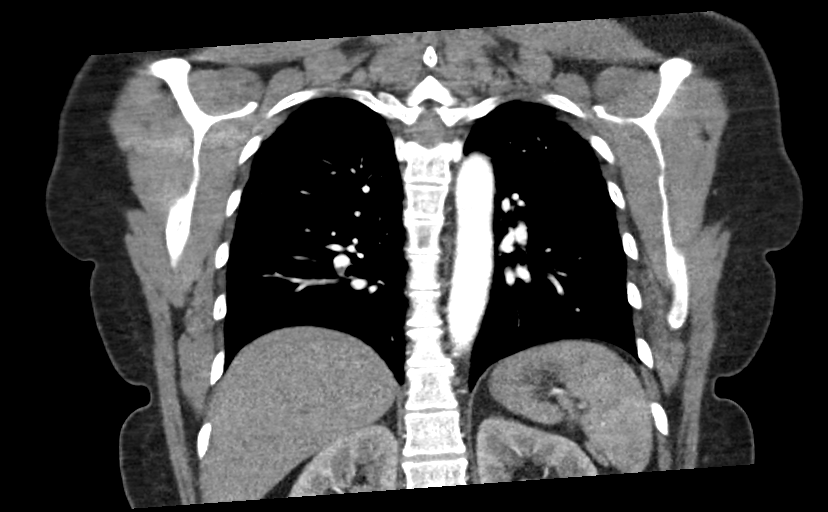

[18 of 46 positions shown; findings below may reference images not displayed]

RADIATION DOSE REDUCTION: This exam was performed according to the
departmental dose-optimization program which includes automated
exposure control, adjustment of the mA and/or kV according to
patient size and/or use of iterative reconstruction technique.

CONTRAST:  75mL OMNIPAQUE IOHEXOL 350 MG/ML SOLN
FINDINGS: Cardiovascular: There is no cardiomegaly or pericardial effusion.
The thoracic aorta is unremarkable. The origins of the great vessels
of the aortic arch appear patent as visualized. Evaluation of the
pulmonary arteries is limited due to respiratory motion artifact. No
pulmonary artery embolus identified.

Mediastinum/Nodes: No hilar or mediastinal adenopathy. The esophagus
is grossly unremarkable. No mediastinal fluid collection.

Lungs/Pleura: The lungs are clear. There is no pleural effusion or
pneumothorax. The central airways are patent.

Upper Abdomen: Cholecystectomy.

Musculoskeletal: No chest wall abnormality. No acute or significant
osseous findings.

Review of the MIP images confirms the above findings.
IMPRESSION: No acute intrathoracic pathology. No CT evidence of pulmonary artery
embolus.
